# Patient Record
Sex: Male | Born: 1945 | Race: White | Hispanic: No | Marital: Married | Smoking: Former smoker
Health system: Southern US, Community
[De-identification: ages and names within clinical notes are randomized; demographics above are authoritative.]

## PROBLEM LIST (undated history)

## (undated) DIAGNOSIS — I10 Essential (primary) hypertension: Secondary | ICD-10-CM

## (undated) DIAGNOSIS — E785 Hyperlipidemia, unspecified: Secondary | ICD-10-CM

## (undated) DIAGNOSIS — K56609 Unspecified intestinal obstruction, unspecified as to partial versus complete obstruction: Secondary | ICD-10-CM

## (undated) DIAGNOSIS — R7302 Impaired glucose tolerance (oral): Secondary | ICD-10-CM

## (undated) DIAGNOSIS — G40909 Epilepsy, unspecified, not intractable, without status epilepticus: Secondary | ICD-10-CM

## (undated) DIAGNOSIS — R569 Unspecified convulsions: Secondary | ICD-10-CM

## (undated) DIAGNOSIS — C61 Malignant neoplasm of prostate: Secondary | ICD-10-CM

## (undated) DIAGNOSIS — N390 Urinary tract infection, site not specified: Secondary | ICD-10-CM

## (undated) HISTORY — PX: APPENDECTOMY: SHX54

## (undated) HISTORY — DX: Essential (primary) hypertension: I10

## (undated) HISTORY — DX: Epilepsy, unspecified, not intractable, without status epilepticus: G40.909

## (undated) HISTORY — DX: Impaired glucose tolerance (oral): R73.02

## (undated) HISTORY — DX: Unspecified convulsions: R56.9

## (undated) HISTORY — PX: COLONOSCOPY W/ BIOPSIES: SHX1374

## (undated) HISTORY — DX: Malignant neoplasm of prostate: C61

## (undated) HISTORY — DX: Hyperlipidemia, unspecified: E78.5

## (undated) HISTORY — DX: Urinary tract infection, site not specified: N39.0

## (undated) HISTORY — PX: FRACTURE SURGERY: SHX138

## (undated) HISTORY — DX: Unspecified intestinal obstruction, unspecified as to partial versus complete obstruction: K56.609

---

## 2000-11-19 ENCOUNTER — Ambulatory Visit (HOSPITAL_COMMUNITY): Admission: RE | Admit: 2000-11-19 | Discharge: 2000-11-19 | Payer: Self-pay | Admitting: General Surgery

## 2002-07-27 ENCOUNTER — Ambulatory Visit (HOSPITAL_COMMUNITY): Admission: RE | Admit: 2002-07-27 | Discharge: 2002-07-27 | Payer: Self-pay | Admitting: General Surgery

## 2006-09-05 ENCOUNTER — Ambulatory Visit: Payer: Self-pay | Admitting: Internal Medicine

## 2006-09-18 ENCOUNTER — Ambulatory Visit (HOSPITAL_COMMUNITY): Admission: RE | Admit: 2006-09-18 | Discharge: 2006-09-18 | Payer: Self-pay | Admitting: Gastroenterology

## 2006-09-18 ENCOUNTER — Ambulatory Visit: Payer: Self-pay | Admitting: Gastroenterology

## 2006-09-18 ENCOUNTER — Encounter (INDEPENDENT_AMBULATORY_CARE_PROVIDER_SITE_OTHER): Payer: Self-pay | Admitting: Specialist

## 2006-10-08 ENCOUNTER — Inpatient Hospital Stay (HOSPITAL_COMMUNITY): Admission: RE | Admit: 2006-10-08 | Discharge: 2006-10-14 | Payer: Self-pay | Admitting: General Surgery

## 2006-10-08 ENCOUNTER — Encounter (INDEPENDENT_AMBULATORY_CARE_PROVIDER_SITE_OTHER): Payer: Self-pay | Admitting: General Surgery

## 2006-10-08 HISTORY — PX: COLONOSCOPY W/ BIOPSIES AND POLYPECTOMY: SHX1376

## 2006-10-08 HISTORY — PX: COLON SURGERY: SHX602

## 2008-01-22 ENCOUNTER — Emergency Department (HOSPITAL_COMMUNITY): Admission: EM | Admit: 2008-01-22 | Discharge: 2008-01-22 | Payer: Self-pay | Admitting: Emergency Medicine

## 2008-07-26 ENCOUNTER — Ambulatory Visit (HOSPITAL_COMMUNITY): Admission: RE | Admit: 2008-07-26 | Discharge: 2008-07-26 | Payer: Self-pay | Admitting: Family Medicine

## 2008-07-27 ENCOUNTER — Encounter (HOSPITAL_COMMUNITY): Admission: RE | Admit: 2008-07-27 | Discharge: 2008-08-30 | Payer: Self-pay | Admitting: Family Medicine

## 2009-02-14 ENCOUNTER — Inpatient Hospital Stay (HOSPITAL_COMMUNITY): Admission: EM | Admit: 2009-02-14 | Discharge: 2009-02-16 | Payer: Self-pay | Admitting: Emergency Medicine

## 2009-09-26 ENCOUNTER — Ambulatory Visit (HOSPITAL_COMMUNITY): Admission: RE | Admit: 2009-09-26 | Discharge: 2009-09-26 | Payer: Self-pay | Admitting: General Surgery

## 2010-03-02 ENCOUNTER — Emergency Department (HOSPITAL_COMMUNITY): Admission: EM | Admit: 2010-03-02 | Discharge: 2010-03-03 | Payer: Self-pay | Admitting: Emergency Medicine

## 2010-08-16 LAB — DIFFERENTIAL
Basophils Absolute: 0 10*3/uL (ref 0.0–0.1)
Basophils Absolute: 0 10*3/uL (ref 0.0–0.1)
Basophils Relative: 0 % (ref 0–1)
Basophils Relative: 0 % (ref 0–1)
Eosinophils Absolute: 0.1 10*3/uL (ref 0.0–0.7)
Eosinophils Relative: 1 % (ref 0–5)
Eosinophils Relative: 2 % (ref 0–5)
Lymphocytes Relative: 15 % (ref 12–46)
Lymphs Abs: 1.3 10*3/uL (ref 0.7–4.0)
Monocytes Absolute: 0.4 10*3/uL (ref 0.1–1.0)
Monocytes Absolute: 0.4 10*3/uL (ref 0.1–1.0)
Monocytes Relative: 5 % (ref 3–12)
Monocytes Relative: 9 % (ref 3–12)
Neutro Abs: 3.4 10*3/uL (ref 1.7–7.7)
Neutrophils Relative %: 65 % (ref 43–77)
Neutrophils Relative %: 74 % (ref 43–77)
Neutrophils Relative %: 78 % — ABNORMAL HIGH (ref 43–77)

## 2010-08-16 LAB — COMPREHENSIVE METABOLIC PANEL
ALT: 20 U/L (ref 0–53)
ALT: 21 U/L (ref 0–53)
Albumin: 4 g/dL (ref 3.5–5.2)
Alkaline Phosphatase: 180 U/L — ABNORMAL HIGH (ref 39–117)
BUN: 18 mg/dL (ref 6–23)
Calcium: 8.6 mg/dL (ref 8.4–10.5)
Calcium: 9.2 mg/dL (ref 8.4–10.5)
Chloride: 100 mEq/L (ref 96–112)
Chloride: 103 mEq/L (ref 96–112)
Creatinine, Ser: 0.68 mg/dL (ref 0.4–1.5)
Creatinine, Ser: 0.77 mg/dL (ref 0.4–1.5)
GFR calc Af Amer: >60 mL/min (ref >60)
GFR calc non Af Amer: >60 mL/min (ref >60)
Glucose, Bld: 106 mg/dL — ABNORMAL HIGH (ref 70–99)
Potassium: 4 mEq/L (ref 3.5–5.1)
Sodium: 139 mEq/L (ref 135–145)
Total Bilirubin: 0.2 mg/dL — ABNORMAL LOW (ref 0.3–1.2)
Total Protein: 6.9 g/dL (ref 6.0–8.3)
Total Protein: 7.5 g/dL (ref 6.0–8.3)

## 2010-08-16 LAB — URINALYSIS, ROUTINE W REFLEX MICROSCOPIC
Glucose, UA: NEGATIVE mg/dL
Ketones, ur: NEGATIVE mg/dL
Nitrite: NEGATIVE
Protein, ur: NEGATIVE mg/dL

## 2010-08-16 LAB — CBC
HCT: 40.8 % (ref 39.0–52.0)
Hemoglobin: 13.4 g/dL (ref 13.0–17.0)
Hemoglobin: 14.6 g/dL (ref 13.0–17.0)
MCHC: 34.5 g/dL (ref 30.0–36.0)
MCHC: 34.6 g/dL (ref 30.0–36.0)
MCV: 86.1 fL (ref 78.0–100.0)
MCV: 86.1 fL (ref 78.0–100.0)
MCV: 86.3 fL (ref 78.0–100.0)
Platelets: 176 10*3/uL (ref 150–400)

## 2010-08-16 LAB — PHENOBARBITAL LEVEL: Phenobarbital: 14 ug/mL — ABNORMAL LOW (ref 15.0–40.0)

## 2010-08-16 LAB — BASIC METABOLIC PANEL
BUN: 8 mg/dL (ref 6–23)
Chloride: 103 mEq/L (ref 96–112)

## 2010-08-16 LAB — PROTIME-INR: Prothrombin Time: 13.4 seconds (ref 11.6–15.2)

## 2010-08-16 LAB — AMYLASE: Amylase: 89 U/L (ref 27–131)

## 2010-08-16 LAB — TYPE AND SCREEN: ABO/RH(D): O POS

## 2010-09-25 NOTE — Op Note (Signed)
NAME:  Brent Suarez, Brent Suarez NO.:  [id number]   MEDICAL RECORD NO.:  [id number]          PATIENT TYPE:  INP   LOCATION:  A339                          FACILITY:  [facility]   PHYSICIAN:  Dalia Heading, M.D.  DATE OF BIRTH:  [birth year]   DATE OF PROCEDURE:  10/08/2006  DATE OF DISCHARGE:                               OPERATIVE REPORT   PREOPERATIVE DIAGNOSIS:  Colon neoplasm.   POSTOPERATIVE DIAGNOSIS:  Colon neoplasm.   PROCEDURE:  Hand-assisted laparoscopic partial colectomy, right.   SURGEON:  Franky Macho, MD   ANESTHESIA:  General endotracheal.   INDICATIONS:  The patient is a 65 year old white male who has had  multiple attempts at resection of a proximal transverse colon neoplasm,  but it keeps recurring.  The patient now comes to the operating room for  a laparoscopic right hemicolectomy.  The risks and benefits of the  procedure including bleeding, infection, cardiopulmonary difficulties,  and the possibility of a blood transfusion were fully explained to the  patient, who gave informed consent.   PROCEDURE NOTE:  The patient was placed in the supine position.  After  induction of general endotracheal anesthesia, the abdomen was prepped  and draped using the usual sterile technique with Betadine.  Surgical  site confirmation was performed.   A midline infraumbilical incision was made into the peritoneum.  A  GelPort was then inserted.  An additional 11-mm trocar was placed in the  supraumbilical region and an 11-mm trocar was placed in the epigastric  region.  The abdomen was insufflated.  The liver was inspected and noted  to be within normal limits.  The nasogastric tube was noted be in  appropriate position in the stomach.  The right colon was mobilized  using blunt dissection as well as cautery; this was done to the proximal  transverse colon.  The lesion was marked previously by tattooing  endoscopically; this was visualized.  A GIA stapler was placed  across  the terminal ileum and fired.  The mesentery was then divided using the  laparoscopic LigaSure; this was done to the point of the proximal  transverse colon, proximal to the middle colic artery.  The intestines  were then exteriorized and a GIA stapler was placed across the  transverse colon at a point distal to the tattoo mark.  The specimen was  then removed from the operative field.  It was inspected in the  operating room and the distal margin was noted be free of tumor.  The  specimen was then sent to Pathology for further examination.  An  additional 5-cm of transverse colon was excised in order to facilitate  an anastomosis.  An ileocolic side-to-side anastomosis was then  performed using a GIA 70 stapler.  The enterotomy was closed using a TA-  60 stapler.  The staple line was bolstered using 3-0 silk sutures.  Omentum was then used to cover the anastomosis.  The mesenteric defect  was closed using an 0 chromic gut running suture.  The bowel was then  returned into the abdominal cavity in an orderly fashion.  The abdominal  cavity was first copiously irrigated with normal saline.  All fluid was  then evacuate from the abdominal cavity.   The fascia was reapproximated using a looped 0 Novofil running suture.  The subcutaneous layer was irrigated with normal saline and skin was  closed using staples; this was likewise done to the epigastric incision.  Of note was the fact that after the bowel was exteriorized, the lower  midline incision was extended to the supraumbilical incision in order to  facilitate exposure of the transverse colon for the extracorporeal  anastomosis.  Betadine ointment and dry sterile dressing were applied.   All tape and needle counts were correct at the end of the procedure.  The patient was extubated in the operating room and went back to the  recovery room, awake and in stable condition.   COMPLICATIONS:  None.   SPECIMEN:  Terminal ileum,  ascending colon, transverse colon.   BLOOD LOSS:  150 mL      Dalia Heading, M.D.  Electronically Signed     MAJ/MEDQ  D:  10/08/2006  T:  10/08/2006  Job:  [id number]   cc:   Kassie Mends, M.D.  [address]  [place] , [place] [address]   W. Simone Curia, M.D.  Fax: [phone]

## 2010-09-25 NOTE — Discharge Summary (Signed)
NAME:  Brent Suarez, STENCIL NO.:  [id number]   MEDICAL RECORD NO.:  [id number]          PATIENT TYPE:  INP   LOCATION:  A339                          FACILITY:  [facility]   PHYSICIAN:  Dalia Heading, M.D.  DATE OF BIRTH:  [birth year]   DATE OF ADMISSION:  10/08/2006  DATE OF DISCHARGE:  06/03/2008LH                               DISCHARGE SUMMARY   HOSPITAL COURSE SUMMARY:  The patient is a 65 year old white male who  presented to Surgery for a partial colectomy.  He had recurrent tubular  adenoma in the proximal transverse colon.  Despite multiple  colonoscopies and polypectomies, the neoplasm continued to recur.  He  underwent a hand-assisted laparoscopic right hemicolectomy on Oct 08, 2006.  He tolerated the surgery well.  His postoperative course was  remarkable for a postoperative ileus which subsequently resolved.  His  diet was advanced without difficulty except for one episode of emesis  yesterday which was felt to be secondary to milk of magnesia and  MiraLax.  He is being discharged home on 10/14/2006 in good, improving  condition.  Final pathology revealed a benign tubular adenoma without  evidence of malignancy.   DISCHARGE MEDICATIONS INCLUDE:  1. Vicodin 1-2 tablets p.o. q.4 h p.r.n. pain.  2. Baby aspirin 1 tablet p.o. daily.  3. Dilantin 300 mg p.o. q.h.s.  4. Phenobarbital 9cih mg p.o. q.h.s.  5. Prevacid 1 tablet p.o. daily.   FOLLOW UP:  The patient is to follow up Dr. Franky Macho on October 16, 2006.   PRINCIPAL DIAGNOSES:  1. Tubulovillous adenoma of colon.  2. History of seizure disorder.  3. Postoperative ileus, resolved.   PRINCIPAL PROCEDURE:  Hand-assisted laparoscopic right colectomy on Oct 08, 2006.      Dalia Heading, M.D.  Electronically Signed     MAJ/MEDQ  D:  10/14/2006  T:  10/14/2006  Job:  [id number]   cc:   Donna Bernard, M.D.  Fax: [phone]   Kassie Mends, M.D.  [address]  [place] , [place] [address]

## 2010-09-25 NOTE — H&P (Signed)
NAME:  Brent Suarez, HOLLINGS NO.:  [id number]   MEDICAL RECORD NO.:  [id number]          PATIENT TYPE:  AMB   LOCATION:                                FACILITY:  [facility]   PHYSICIAN:  Dalia Heading, M.D.  DATE OF BIRTH:  [birth year]   DATE OF ADMISSION:  DATE OF DISCHARGE:  LH                              HISTORY & PHYSICAL   CHIEF COMPLAINT:  Neoplasm.   HISTORY OF PRESENT ILLNESS:  The patient is a 65 year old white male who  is referred for evaluation and treatment of a recurrent tubular adenoma  of the proximal transverse colon.  He has had multiple colonoscopies and  polypectomies in the past.  But, the neoplasm continues to recur.  It is  felt that further attempts at colonoscopic resection may result in  perforation of the colon.  He is referred for a colon resection.   PAST MEDICAL HISTORY:  1. Epilepsy with his last seizure occurring in the remote past.  2. Right arm surgery.   CURRENT MEDICATIONS:  1. Dilantin 300 mg p.o. q.h.s.  2. Phenobarbital 90 mg p.o. q.h.s.   ALLERGIES:  NO KNOWN DRUG ALLERGIES.   REVIEW OF SYSTEMS:  The patient denies drinking or smoking.  Denies any  other cardiopulmonary difficulties or bleeding disorders.   PHYSICAL EXAMINATION:  GENERAL:  The patient was a well-developed, well-  nourished white male in no acute distress.  LUNGS:  Clear to auscultation with equal breath sounds bilaterally.  HEART:  Reveals a regular rate and rhythm without S3, S4, or murmurs.  ABDOMEN:  Soft, nontender, nondistended.  No hepatosplenomegaly, masses,  or hernias are identified.   IMPRESSION:  Neoplasm, unspecified.   PLAN:  The patient is scheduled for a laparoscopic, hand-assisted,  partial colectomy on Oct 08, 2006.  The risks and benefits of the  procedure including bleeding, infection, cardiopulmonary difficulties,  the possibility of a blood transfusion, as well as the possibility of a  full open procedure were fully explained to the  patient, who gave  informed consent.      Dalia Heading, M.D.  Electronically Signed     MAJ/MEDQ  D:  09/25/2006  T:  09/25/2006  Job:  [id number]   cc:   [facility]  Fax: [phone]   Kassie Mends, M.D.  [address]  [place] , [place] [address]   W. Simone Curia, M.D.  Fax: [phone]

## 2010-09-28 NOTE — H&P (Signed)
NAME:  Brent Suarez, Brent Suarez NO.:  [id number]   MEDICAL RECORD NO.:  [id number]         PATIENT TYPE:  AMB   LOCATION:                                FACILITY:  [facility]   PHYSICIAN:  R. Roetta Sessions, M.D. DATE OF BIRTH:  1945/09/16   DATE OF ADMISSION:  DATE OF DISCHARGE:  LH                              HISTORY & PHYSICAL   GASTROENTEROLOGY CONSULT:   REFERRING PHYSICIAN:  Donna Bernard, M.D.   REASON FOR CONSULTATION:  History of tubular adenoma.   HISTORY OF PRESENT ILLNESS:  Brent Suarez is a 65 year old Caucasian male  who underwent colonoscopy by Dr. Lovell Sheehan back in 2002.  He was found to  have a tubular adenoma at the proximal transverse colon.  It was removed  in piecemeal fashion.  He then had a followup colonoscopy in July 2002.  In the proximal transverse colon, a sessile remnant of the adenoma was  found, which was removed and fulgurated using snare cautery.  Biopsy  showed inflammation.  He had a third colonoscopy, August 06, 2002, by Dr.  Lovell Sheehan and was found to have a single friable nonbleeding sessile polyp  in the proximal transverse colon which was also a tubular adenoma.  It  was recommended that he have a repeat exam in 2005; however, he is  presenting today for followup.  He denies any GI complaints at this  time.  He has noticed 1 episode of small-volume bright red blood noted  on his toilet paper with wiping; other than this, he denies any ongoing  rectal bleeding or melena.  He denies any abdominal pain, constipation  or diarrhea.  He generally has a daily soft brown bowel movement.   PAST MEDICAL HISTORY:  1. Epilepsy; his last seizure was over 25 years ago.  2. History of multiple tubular adenomas in the transverse colon with      previous colonoscopies by Dr. Lovell Sheehan as described in HPI.  3. He had a right arm fracture and repair.   CURRENT MEDICATIONS:  1. Aspirin 81 mg daily.  2. Dilantin 300 mg nightly.  3. Phenobarbital 90 mg  nightly.   ALLERGIES:  No known drug allergies.   FAMILY HISTORY:  There is no known family history of colorectal  carcinoma, liver or chronic GI problems.   SOCIAL HISTORY:  [name] is married and he has 2 grown healthy  children.  He is employed with [employer].  He has a 30-plus-pack-year  history of tobacco use, quitting 20 years ago.  He denies any alcohol or  drug use.   REVIEW OF SYSTEMS:  CONSTITUTIONAL:  His weight has been stable.  He  denies any fever, chills or fatigue.  CARDIOVASCULAR:  He denies any  chest pain or palpitations.  PULMONARY:  No shortness of breath,  dyspnea, cough or hemoptysis.  GI:  See HPI.  He does have rare  heartburn and does not take any medicines for this.  He denies any  dysphagia, odynophagia, anorexia or early satiety.   PHYSICAL EXAMINATION:  VITAL SIGNS:  Weight 208 pounds, height 71  inches.  Temperature 97.4, blood pressure 112/80 and pulse of 60.  GENERAL:  Brent Suarez is a 65 year old Caucasian male who is alert,  oriented, pleasant and cooperative, in no acute distress.  He is  accompanied by his wife today.  HEENT:  Sclerae clear, anicteric.  Conjunctivae pink.  Oropharynx is  pink and moist without any lesions.  NECK:  Supple without mass or thyromegaly.  CHEST:  Heart with regular rate and rhythm, normal S1 and S2, without  murmurs, clicks, rubs or gallops.  LUNGS:  Clear to auscultation bilaterally.  ABDOMEN.  Positive bowel sounds x4.  No bruits auscultated.  He does  have a small, easily reducible umbilical hernia which is nontender.  The  abdomen is without tenderness or guarding.  There is no  hepatosplenomegaly or mass.  Exam is limited, given the patient's body  habitus.  EXTREMITIES:  Without clubbing or edema bilaterally.  SKIN:  Pink, warm and dry without any rash or jaundice.   IMPRESSION:  Brent Suarez is a 65 year old male with history of large  tubular adenoma in transverse colon, removed by Dr. Lovell Sheehan back in  2002.   Six months later, he had piecemeal complete removal of an  adenoma.  Two years later, he had another tubular adenoma in the  transverse colon removed by Dr. Lovell Sheehan.  He is overdue for followup  surveillance colonoscopy.   PLAN:  Colonoscopy with Dr. Cira Servant in the near future.  I discussed the  procedure including the risks and benefits including, but not limited  to, bleeding, infection, perforation or drug reaction.  He agrees with  plan and consent has been obtained.  He is going to hold his aspirin for  3 days prior to the procedure.   I would like to thank Dr. Simone Curia for allowing [place] to participate  in the care of Mr. Brent Suarez.      Nicholas Lose, N.P.      Jonathon Bellows, M.D.  Electronically Signed    KC/MEDQ  D:  09/05/2006  T:  09/05/2006  Job:  [id number]   cc:   Donna Bernard, M.D.  Fax: [phone]

## 2010-09-28 NOTE — Op Note (Signed)
NAME:  Brent Suarez, Brent Suarez NO.:  [id number]   MEDICAL RECORD NO.:  [id number]          PATIENT TYPE:  AMB   LOCATION:  DAY                           FACILITY:  [facility]   PHYSICIAN:  Kassie Mends, M.D.      DATE OF BIRTH:  Sep 04, 1945   DATE OF PROCEDURE:  09/18/2006  DATE OF DISCHARGE:                                PROCEDURE NOTE   PROCEDURE:  Colonoscopy with snare cautery, partial polypectomy.   INDICATIONS FOR EXAM:  Brent Suarez is a 65 year old male who had a large  transverse colon polyp removed piecemeal in 2004.  He presents for  screening colonoscopy.   FINDINGS:  1. Large proximal transverse colon polyp.  The transverse colon polyp      again is seen on a fold and spreads out proximally towards the      ascending colon.  The polyp was only able to be removed partially      via snare cautery.  The most proximal portion of the polyp would      need to be removed via endoscopic mucosal resection.  The polyp was      tattooed proximally with 2 mL of [place] ink.  It was also tattooed      distally with 2 mL of [place] ink.  2. Otherwise no additional polyps, masses, inflammatory changes,      diverticula or AVMs.  3. Normal retroflex view of the rectum.   RECOMMENDATIONS:  1. I discussed with Brent Suarez his management options for the      remaining portion of his polyp.  His treatment options included      endoscopic mucosal resection at [facility] or surgery.  He      elected to have surgical resection.  2. He has an appointment scheduled with Dr. Franky Macho on May 15.  3. Low-residue diet for seven days, then high-fiber diet.  He is given      a handout on low-residue diet, high-fiber diet and polyps.  4. No aspirin, NSAIDs, or anticoagulation for seven days.  5. Screening colonoscopy in 5 years.   MEDICATIONS:  1. Demerol 50 mg IV.  2. Four milligrams IV.   PROCEDURE TECHNIQUE:  Physical exam was performed.  Informed consent was  obtained from the  patient explaining the benefits, risks and  alternatives to the procedure.  The patient was connected to the monitor  and placed in the left lateral position.  Continuous oxygen was provided  by nasal cannula and IV medicine administered through an indwelling  cannula.  After administration of sedation and rectal exam, the  patient's rectum was intubated  and the scope was advanced under direct visualization to the cecum.  The  scope was subsequently removed slowly by carefully examining the color,  texture, anatomy and integrity of the mucosa on the way out.  The  patient was recovered from endoscopy and discharged home in satisfactory  condition.      Kassie Mends, M.D.  Electronically Signed     SM/MEDQ  D:  09/18/2006  T:  09/18/2006  Job:  [id number]   cc:   Donna Bernard, M.D.  Fax: [phone]   Dalia Heading, M.D.  Fax: [phone]

## 2010-09-28 NOTE — H&P (Signed)
   NAME:  Brent Suarez, Brent Suarez NO.:  [id number]   MEDICAL RECORD NO.:  [id number]                   PATIENT TYPE:   LOCATION:                                       FACILITY:   PHYSICIAN:  Dalia Heading, M.D.               DATE OF BIRTH:   DATE OF ADMISSION:  DATE OF DISCHARGE:                                HISTORY & PHYSICAL   CHIEF COMPLAINT:  History of colon polyps.   HISTORY OF PRESENT ILLNESS:  The patient is a 65 year old, white male status  post a colonoscopy with polypectomy in July 2002, who now presents for a  followup colonoscopy.  The polyp was noted in the proximal transverse colon.  It was removed in a piecemeal fashion previously.  Since that time, he  denies any lightheadedness, weight loss, fever, abdominal pain,  constipation, diarrhea or hematochezia.  He denies hemorrhoidal problems or  family history of colon carcinoma.   PAST MEDICAL HISTORY:  Seizure disorder, unspecified.   PAST SURGICAL HISTORY:  As noted in HPI.   MEDICATIONS:  1. Dilantin.  2. Phenobarbital.   ALLERGIES:  No known drug allergies.   REVIEW OF SYMPTOMS:  Unremarkable.   PHYSICAL EXAMINATION:  GENERAL:  The patient is a well-developed, well-  nourished, white male in no acute distress.  VITAL SIGNS:  Afebrile, vital signs stable.  LUNGS:  Clear to auscultation with equal breath sounds bilaterally.  HEART:  Regular rate and rhythm without S3, S4 or murmurs.  ABDOMEN:  Soft, nontender, nondistended, no hepatosplenomegaly or masses  were noted.  RECTAL:  Deferred to the procedure.   IMPRESSION:  History of tubular adenoma, transverse colon.    PLAN:  The patient was scheduled for a colonoscopy on July 01, 2002.  The risks and benefits of the procedure including bleeding and perforation  were fully explained to the patient gaining informed consent.                                               Dalia Heading, M.D.    MAJ/MEDQ  D:  06/03/2002   T:  06/03/2002  Job:  [id number]   cc:   Donna Bernard, M.D.  [address]  [place]  [place] [address]  Fax: [phone]

## 2010-10-01 ENCOUNTER — Other Ambulatory Visit (HOSPITAL_COMMUNITY): Payer: Self-pay | Admitting: Urology

## 2010-10-04 ENCOUNTER — Ambulatory Visit (HOSPITAL_COMMUNITY)
Admission: RE | Admit: 2010-10-04 | Discharge: 2010-10-04 | Disposition: A | Discharge status: Home / Self Care | Payer: BC Managed Care – PPO | Source: Ambulatory Visit | Attending: Urology | Admitting: Urology

## 2010-10-04 DIAGNOSIS — R319 Hematuria, unspecified: Secondary | ICD-10-CM | POA: Insufficient documentation

## 2010-10-04 DIAGNOSIS — K429 Umbilical hernia without obstruction or gangrene: Secondary | ICD-10-CM | POA: Insufficient documentation

## 2010-10-04 DIAGNOSIS — K5730 Diverticulosis of large intestine without perforation or abscess without bleeding: Secondary | ICD-10-CM | POA: Insufficient documentation

## 2010-10-04 DIAGNOSIS — Z9049 Acquired absence of other specified parts of digestive tract: Secondary | ICD-10-CM | POA: Insufficient documentation

## 2010-10-04 DIAGNOSIS — N400 Enlarged prostate without lower urinary tract symptoms: Secondary | ICD-10-CM | POA: Insufficient documentation

## 2010-10-04 MED ORDER — IOHEXOL 300 MG/ML  SOLN
150.0000 mL | Freq: Once | INTRAMUSCULAR | Status: AC | PRN
  Administered 2010-10-04: 150 mL via INTRAVENOUS

## 2010-10-29 ENCOUNTER — Encounter (HOSPITAL_COMMUNITY)
Admission: RE | Admit: 2010-10-29 | Discharge: 2010-10-29 | Disposition: A | Discharge status: Home / Self Care | Payer: BC Managed Care – PPO | Source: Ambulatory Visit | Attending: Urology | Admitting: Urology

## 2010-10-29 LAB — CBC
Hemoglobin: 14.4 g/dL (ref 13.0–17.0)
MCH: 29.4 pg (ref 26.0–34.0)
MCHC: 34.3 g/dL (ref 30.0–36.0)
WBC: 5.2 10*3/uL (ref 4.0–10.5)

## 2010-10-29 LAB — SURGICAL PCR SCREEN
MRSA, PCR: POSITIVE — AB
Staphylococcus aureus: POSITIVE — AB

## 2010-10-29 LAB — BASIC METABOLIC PANEL
CO2: 29 mEq/L (ref 19–32)
Chloride: 103 mEq/L (ref 96–112)
GFR calc non Af Amer: >60 mL/min (ref >60)
Potassium: 4.3 mEq/L (ref 3.5–5.1)

## 2010-10-31 ENCOUNTER — Ambulatory Visit (HOSPITAL_COMMUNITY)
Admission: RE | Admit: 2010-10-31 | Discharge: 2010-10-31 | Disposition: A | Discharge status: Home / Self Care | Payer: BC Managed Care – PPO | Source: Ambulatory Visit | Attending: Urology | Admitting: Urology

## 2010-10-31 ENCOUNTER — Ambulatory Visit (HOSPITAL_COMMUNITY): Payer: BC Managed Care – PPO

## 2010-10-31 DIAGNOSIS — I10 Essential (primary) hypertension: Secondary | ICD-10-CM | POA: Insufficient documentation

## 2010-10-31 DIAGNOSIS — Z79899 Other long term (current) drug therapy: Secondary | ICD-10-CM | POA: Insufficient documentation

## 2010-10-31 DIAGNOSIS — Z7982 Long term (current) use of aspirin: Secondary | ICD-10-CM | POA: Insufficient documentation

## 2010-10-31 DIAGNOSIS — N1330 Unspecified hydronephrosis: Secondary | ICD-10-CM | POA: Insufficient documentation

## 2010-10-31 DIAGNOSIS — Z01812 Encounter for preprocedural laboratory examination: Secondary | ICD-10-CM | POA: Insufficient documentation

## 2010-10-31 DIAGNOSIS — R319 Hematuria, unspecified: Secondary | ICD-10-CM | POA: Insufficient documentation

## 2010-10-31 NOTE — H&P (Signed)
NAME:  Brent Suarez, Brent Suarez NO.:  [id number]  MEDICAL RECORD NO.:  [id number]  LOCATION:  DAY                           FACILITY:  [facility]  PHYSICIAN:  Dennie Maizes, M.D.   DATE OF BIRTH:  [birth year]  DATE OF ADMISSION:  10/25/2010 DATE OF DISCHARGE:  LH                             HISTORY & PHYSICAL   CHIEF COMPLAINT:  Painful urination, intermittent, gross hematuria.  HISTORY OF PRESENT ILLNESS:  A 64 year old male was referred to me by Dr. Simone Curia for evaluation and management of hematuria.  The patient experienced painful urination as well as intermittent hematuria for about 2-3 days.  He did not have any fever, chills, voiding difficulty, abdominal pain.  He had mild bilateral lower quadrant abdominal pain on both sides, had urinary frequency x5 and nocturia x4 to 5.  Also past history of urolithiasis, urinary tract infections.  PAST MEDICAL HISTORY:  History of hypertension, seizure disorder, status post colon resection for benign lesion about 4 years ago.  MEDICATIONS: 1. Aspirin 81 mg 1 p.o. daily. 2. Simvastatin 10 mg p.o. daily. 3. Phenytoin sodium 100 mg 3 tablets at bedtime. 4. Phenobarbital 30 mg x3 at bedtime.  ALLERGIES:  None.  FAMILY HISTORY:  Positive for heart surgery, coronary artery disease, high blood pressure, high cholesterol, carcinoma of the lung.  REVIEW OF SYSTEMS:  Positive for tiredness, sluggishness, abdominal pain, painful urination, urinary frequency.  SOCIAL HISTORY:  The patient is a nonsmoker.  He does not drink alcohol.  PHYSICAL EXAMINATION:  GENERAL:  The patient is comfortable at the time of examination. VITAL SIGNS:  Height 5 feet 10 inches, weight 194 pounds. HEAD, NECK, EARS, NOSE, AND THROAT:  Normal LUNGS:  Clear to auscultation. HEART:  Regular rate and rhythm.  No murmurs. ABDOMEN:  Soft.  No palpable flat masses or CVA tenderness.  Bladder not palpable.  Penis and testes normal. RECTAL:  Benign  prostate about 45 g in size.  The patient's renal function is normal.  BUN 13, creatinine 0.93.  Urine cytology revealed no evidence of malignant or dysplastic cells.  CT scan of the abdomen and pelvis with and without contrast has been done at [facility].  This revealed no evidence of renal masses or lesions.  There is no evidence of urinary calculi, moderately dilated right renal collecting system and renal pelvis are noted, minimal dilation of the left collecting system was seen.  These findings are unchanged compared to the previous CT scan done in October 2010.  There is no filling defects in the collecting system.  Minimal colonic diverticulosis was noted.  The appearance of bladder was normal, except bladder wall thickening.  IMPRESSION:  Hematuria, bilateral hydronephrosis.  PLAN:  I discussed with patient regarding the evaluation so far.  He was scheduled to undergo cystoscopy, possible bladder biopsy, bilateral retrograde pyelograms, and anesthesia at [facility].  I have informed the patient regarding the diagnosis, operative details, alternative treatments, outcome, possible risks and complications, and he is agreed for the procedure to be done.     Dennie Maizes, M.D.     SK/MEDQ  D:  10/30/2010  T:  10/31/2010  Job:  [id number]  cc:   Donna Bernard, M.D. Fax: [phone]  [facility]  Electronically Signed by Dennie Maizes M.D. on 10/31/2010 10:44:42 AM

## 2011-02-28 LAB — BASIC METABOLIC PANEL
Calcium: 7.9 — ABNORMAL LOW
Creatinine, Ser: 0.8
GFR calc Af Amer: >60

## 2011-02-28 LAB — DIFFERENTIAL
Basophils Relative: 0
Lymphs Abs: 0.9
Monocytes Relative: 12 — ABNORMAL HIGH
Neutro Abs: 2.9
Neutrophils Relative %: 65

## 2011-02-28 LAB — CBC
RBC: 3.99 — ABNORMAL LOW
WBC: 4.5

## 2011-10-10 DIAGNOSIS — C61 Malignant neoplasm of prostate: Secondary | ICD-10-CM

## 2011-10-10 HISTORY — DX: Malignant neoplasm of prostate: C61

## 2011-10-25 ENCOUNTER — Encounter: Payer: Self-pay | Admitting: *Deleted

## 2011-10-28 ENCOUNTER — Ambulatory Visit
Admission: RE | Admit: 2011-10-28 | Discharge: 2011-10-28 | Disposition: A | Discharge status: Home / Self Care | Payer: BC Managed Care – PPO | Source: Ambulatory Visit | Attending: Radiation Oncology | Admitting: Radiation Oncology

## 2011-10-28 ENCOUNTER — Telehealth: Payer: Self-pay | Admitting: Radiation Oncology

## 2011-10-28 ENCOUNTER — Encounter: Payer: Self-pay | Admitting: Radiation Oncology

## 2011-10-28 VITALS — BP 120/75 | HR 79 | Temp 96.4°F | Resp 18 | Ht 70.0 in | Wt 206.7 lb

## 2011-10-28 DIAGNOSIS — Z79899 Other long term (current) drug therapy: Secondary | ICD-10-CM | POA: Insufficient documentation

## 2011-10-28 DIAGNOSIS — E785 Hyperlipidemia, unspecified: Secondary | ICD-10-CM | POA: Insufficient documentation

## 2011-10-28 DIAGNOSIS — C61 Malignant neoplasm of prostate: Secondary | ICD-10-CM | POA: Insufficient documentation

## 2011-10-28 DIAGNOSIS — I129 Hypertensive chronic kidney disease with stage 1 through stage 4 chronic kidney disease, or unspecified chronic kidney disease: Secondary | ICD-10-CM | POA: Insufficient documentation

## 2011-10-28 DIAGNOSIS — N189 Chronic kidney disease, unspecified: Secondary | ICD-10-CM | POA: Insufficient documentation

## 2011-10-28 NOTE — Telephone Encounter (Signed)
Following completion by Dr. Kathrynn Running and at the patient request faxed [employer] 3 page Family Medical Leave Act form to [phone]. Confirmation of delivery fax obtained.

## 2011-10-28 NOTE — Progress Notes (Signed)
Patient presents to the clinic today accompanied by two family members for a consultation with Dr. Kathrynn Running for consideration of radiation therapy in the treatment of prostate ca. Patient is alert and oriented to person, place, and time. No distress noted. Steady gait noted. Pleasant affect noted. Patient denies pain at this time. Patient reports on average he gets up 2-3 times per night to void. Patient reports taking Flomax for 3.5 weeks. Patient denies hematuria. Patient reports his stream alternates between strong and weak. Patient denies burning with urination. Patient reports internal and external hemorrhoids. Patient denies pain during bowel movements. Patient denies blood in his stool. Patient works five days a week. Patient reports eating without difficulty. Patient reports his weight has remained stable. Patient denies nausea, vomiting, headache, or dizziness. Patient reports occasional diarrhea. Reported all findings to Dr. Kathrynn Running.  NKDA No history of radiation therapy Denies having a pacemaker Patient not taking steroids

## 2011-10-28 NOTE — Progress Notes (Signed)
Please see progress note under physician encounter 

## 2011-10-28 NOTE — Addendum Note (Signed)
Encounter addended by: Tessa Lerner, RN on: 10/28/2011  4:18 PM<BR>     Documentation filed: Charges VN

## 2011-10-28 NOTE — Progress Notes (Signed)
Complete PATIENT MEASURE OF DISTRESS worksheet with a score of 0 submitted to social work. Also, complete NUTRITION RISK SCREEN worksheet without concerns submitted to Barbara Neff, RD 

## 2011-10-28 NOTE — Progress Notes (Signed)
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 male who was noted to have an elevated PSA of 5.35 on 08/18/2011. Short interval followup PSA remained elevated at 6.03. Consequently, he underwent transrectal ultrasound with prostate biopsies on 10/10/2011. Rectal exam prior to the ultrasound study revealed no appreciable nodules the prostate. One out of 12 core biopsy specimens was positive. Essentially, 2% of one core specimen from the right lateral base showed adenocarcinoma with a Gleason's score of 3+3.    PREVIOUS RADIATION THERAPY: No  PAST MEDICAL HISTORY:  has a past medical history of Prostate cancer (10/10/11); SBO (small bowel obstruction) (10/19/210); UTI (lower urinary tract infection); Hypertension; Chronic kidney disease; Seizures; Epilepsy; and Hyperlipidemia.    PAST SURGICAL HISTORY: Past Surgical History  Procedure Date  . Colonoscopy w/ biopsies and polypectomy 10/08/06    right terminal ileum,hemicolectomy,sessile tubovillous adenomatous polyp,no high grade dysplasia or malignancy  . Colon surgery 10/08/06    right laproscopic partial colectomy,Dr. Franky Macho  . Colonoscopy w/ biopsies 2002,2004,,2008    numerous polypectomies and colonoscopies  . Fracture surgery     right arm fracture and repair    FAMILY HISTORY: family history includes Cancer in his father, maternal uncle, and paternal aunt.  SOCIAL HISTORY:  reports that he quit smoking about 23 years ago. His smoking use included Cigarettes. He has a 25 pack-year smoking history. He has never used smokeless tobacco. He  reports that he does not drink alcohol or use illicit drugs.  ALLERGIES: Review of patient's allergies indicates no known allergies.  MEDICATIONS:  Current Outpatient Prescriptions  Medication Sig Dispense Refill  . aspirin 81 MG tablet Take 81 mg by mouth daily.      Marland Kitchen PHENObarbital (LUMINAL) 30 MG tablet Take 30 mg by mouth 3 (three) times daily. bedtime      . phenytoin (DILANTIN) 100 MG ER capsule Take 100 mg by mouth 3 (three) times daily. At bedtime      . simvastatin (ZOCOR) 10 MG tablet Take 10 mg by mouth at bedtime.      . Tamsulosin HCl (FLOMAX) 0.4 MG CAPS         REVIEW OF SYSTEMS:  A 15 point review of systems is documented in the electronic medical record. This was obtained by the nursing staff. However, I reviewed this with the patient to discuss relevant findings and make appropriate changes.  A comprehensive review of systems was negative. IPS S. score was 22. The patient denies erectile dysfunction.    PHYSICAL EXAM:  height is 5\' 10"  (1.778 m) and weight is 206 lb 11.2 oz (93.759 kg). His oral temperature is 96.4 F (35.8 C). His blood pressure is 120/75 and his pulse is 79. His respiration is 18.   Patient is in no acute distress today. Is alert and oriented. His head is normocephalic and atraumatic. Speech is fluent articulate. Gait is unremarkable. He please note the digital rectal exam findings described above.   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 He's not ideally suited for robotic-assisted laparoscopic radical prostatectomy given previous colon surgery and intraperitoneal scarring. He is also not ideally suited for prostate seed implant given his high  urinary obstructive symptom score.    PLAN:Today I reviewed the findings and workup thus far.  We discussed the natural history of prostate cancer.  We reviewed the the implications of T-stage, Gleason's Score, and PSA on decision-making and outcomes in prostate cancer.  We discussed radiation treatment in the management of prostate cancer with regard to the logistics and delivery of external beam radiation treatment as well as the logistics and delivery of prostate brachytherapy.  We compared and contrasted each of these approaches and also compared these against prostatectomy.  The patient expressed interest in external beam radiotherapy.  I filled out a patient counseling form for him with relevant treatment diagrams and we retained a copy for our records.   The patient would like to proceed with prostate IMRT.  I will share my findings with Dr. Jerre Simon and move forward with scheduling placement of three gold fiducial markers into the prostate to proceed with IMRT in the near future.     I enjoyed meeting with him today, and will look forward to participating in the care of this very nice gentleman.   I spent 60 minutes minutes face to face with the patient and more than 50% of that time was spent in counseling and/or coordination of care.   ------------------------------------------------  Artist Pais. Kathrynn Running, M.D.

## 2011-10-29 NOTE — Addendum Note (Signed)
Encounter addended by: Tessa Lerner, RN on: 10/29/2011  3:50 PM<BR>     Documentation filed: Charges VN

## 2011-10-29 NOTE — Addendum Note (Signed)
Encounter addended by: Oneita Hurt, MD on: 10/29/2011  4:00 PM<BR>     Documentation filed: Normajean Glasgow VN

## 2011-10-30 ENCOUNTER — Telehealth: Payer: Self-pay | Admitting: *Deleted

## 2011-10-30 NOTE — Telephone Encounter (Signed)
Called patient to inform of sim on 11-13-11 at 10:00 am in [place], cancelled 11-15-11 sim in [place], spoke with patient's wife and they are aware of this appt. change

## 2011-10-30 NOTE — Telephone Encounter (Signed)
CALLED PATIENT TO INFORM OF GOLD SEED PLACEMENT ON 11-11-11 AT 12;00 PM AT DR. Rudean Haskell OFFICE AND HIS SIM ON 11-15-11 AT 2:00 PM AT DR. MANNING'S OFFICE, SPOKE WITH PATIENT'S WIFE BONNIE AND THEY ARE AWARE OF THESE APPTS.

## 2011-10-31 ENCOUNTER — Telehealth: Payer: Self-pay | Admitting: *Deleted

## 2011-10-31 NOTE — Telephone Encounter (Signed)
xxxx 

## 2011-11-04 NOTE — Addendum Note (Signed)
Encounter addended by: Agnes Lawrence, RN on: 11/04/2011  4:44 PM<BR>     Documentation filed: Charges VN

## 2011-11-04 NOTE — Addendum Note (Signed)
Encounter addended by: Agnes Lawrence, RN on: 11/04/2011  4:56 PM<BR>     Documentation filed: Charges VN

## 2011-11-04 NOTE — Addendum Note (Signed)
Encounter addended by: Tessa Lerner, RN on: 11/04/2011  4:41 PM<BR>     Documentation filed: Charges VN

## 2011-11-05 ENCOUNTER — Telehealth: Payer: Self-pay | Admitting: *Deleted

## 2011-11-05 NOTE — Telephone Encounter (Signed)
xxxx 

## 2011-11-15 ENCOUNTER — Other Ambulatory Visit: Payer: Self-pay | Admitting: Radiation Oncology

## 2012-04-15 ENCOUNTER — Encounter: Payer: Self-pay | Admitting: *Deleted

## 2012-07-17 ENCOUNTER — Inpatient Hospital Stay (HOSPITAL_COMMUNITY)
Admission: EM | Admit: 2012-07-17 | Discharge: 2012-07-20 | DRG: 180 | Disposition: A | Discharge status: Home / Self Care | Payer: BC Managed Care – PPO | Attending: General Surgery | Admitting: General Surgery

## 2012-07-17 ENCOUNTER — Encounter (HOSPITAL_COMMUNITY): Payer: Self-pay | Admitting: *Deleted

## 2012-07-17 ENCOUNTER — Emergency Department (HOSPITAL_COMMUNITY): Payer: BC Managed Care – PPO

## 2012-07-17 DIAGNOSIS — N189 Chronic kidney disease, unspecified: Secondary | ICD-10-CM | POA: Diagnosis present

## 2012-07-17 DIAGNOSIS — Z8042 Family history of malignant neoplasm of prostate: Secondary | ICD-10-CM

## 2012-07-17 DIAGNOSIS — I129 Hypertensive chronic kidney disease with stage 1 through stage 4 chronic kidney disease, or unspecified chronic kidney disease: Secondary | ICD-10-CM | POA: Diagnosis present

## 2012-07-17 DIAGNOSIS — K5900 Constipation, unspecified: Secondary | ICD-10-CM | POA: Diagnosis present

## 2012-07-17 DIAGNOSIS — Z87891 Personal history of nicotine dependence: Secondary | ICD-10-CM

## 2012-07-17 DIAGNOSIS — C61 Malignant neoplasm of prostate: Secondary | ICD-10-CM | POA: Diagnosis present

## 2012-07-17 DIAGNOSIS — E785 Hyperlipidemia, unspecified: Secondary | ICD-10-CM | POA: Diagnosis present

## 2012-07-17 DIAGNOSIS — Z79899 Other long term (current) drug therapy: Secondary | ICD-10-CM

## 2012-07-17 DIAGNOSIS — Z9221 Personal history of antineoplastic chemotherapy: Secondary | ICD-10-CM

## 2012-07-17 DIAGNOSIS — K56609 Unspecified intestinal obstruction, unspecified as to partial versus complete obstruction: Principal | ICD-10-CM | POA: Diagnosis present

## 2012-07-17 DIAGNOSIS — Z923 Personal history of irradiation: Secondary | ICD-10-CM

## 2012-07-17 LAB — COMPREHENSIVE METABOLIC PANEL
ALT: 14 U/L (ref 0–53)
Albumin: 3.9 g/dL (ref 3.5–5.2)
Alkaline Phosphatase: 184 U/L — ABNORMAL HIGH (ref 39–117)
BUN: 12 mg/dL (ref 6–23)
Chloride: 99 mEq/L (ref 96–112)
Glucose, Bld: 93 mg/dL (ref 70–99)
Potassium: 4.4 mEq/L (ref 3.5–5.1)
Total Bilirubin: 0.3 mg/dL (ref 0.3–1.2)

## 2012-07-17 LAB — CBC WITH DIFFERENTIAL/PLATELET
Basophils Absolute: 0 10*3/uL (ref 0.0–0.1)
Basophils Relative: 0 % (ref 0–1)
Eosinophils Absolute: 0.1 10*3/uL (ref 0.0–0.7)
Eosinophils Relative: 1 % (ref 0–5)
Lymphocytes Relative: 10 % — ABNORMAL LOW (ref 12–46)
MCHC: 35.1 g/dL (ref 30.0–36.0)
MCV: 85.7 fL (ref 78.0–100.0)
Platelets: 165 10*3/uL (ref 150–400)
RDW: 13.1 % (ref 11.5–15.5)
WBC: 7.3 10*3/uL (ref 4.0–10.5)

## 2012-07-17 LAB — LIPASE, BLOOD: Lipase: 23 U/L (ref 11–59)

## 2012-07-17 MED ORDER — ONDANSETRON HCL 4 MG/2ML IJ SOLN
4.0000 mg | Freq: Once | INTRAMUSCULAR | Status: AC
  Administered 2012-07-17: 4 mg via INTRAVENOUS
  Filled 2012-07-17: qty 2

## 2012-07-17 MED ORDER — PANTOPRAZOLE SODIUM 40 MG IV SOLR
40.0000 mg | Freq: Every day | INTRAVENOUS | Status: DC
  Administered 2012-07-17 – 2012-07-19 (×3): 40 mg via INTRAVENOUS
  Filled 2012-07-17 (×3): qty 40

## 2012-07-17 MED ORDER — ENOXAPARIN SODIUM 40 MG/0.4ML ~~LOC~~ SOLN
40.0000 mg | SUBCUTANEOUS | Status: DC
  Administered 2012-07-17 – 2012-07-19 (×3): 40 mg via SUBCUTANEOUS
  Filled 2012-07-17 (×3): qty 0.4

## 2012-07-17 MED ORDER — MORPHINE SULFATE 4 MG/ML IJ SOLN
INTRAMUSCULAR | Status: AC
  Filled 2012-07-17: qty 1

## 2012-07-17 MED ORDER — ONDANSETRON HCL 4 MG/2ML IJ SOLN: 4.0000 mg | Freq: Four times a day (QID) | INTRAMUSCULAR | Status: DC | PRN

## 2012-07-17 MED ORDER — MORPHINE SULFATE 4 MG/ML IJ SOLN
4.0000 mg | Freq: Once | INTRAMUSCULAR | Status: AC
  Administered 2012-07-17: 4 mg via INTRAVENOUS

## 2012-07-17 MED ORDER — HYDROMORPHONE HCL PF 1 MG/ML IJ SOLN: 1.0000 mg | INTRAMUSCULAR | Status: DC | PRN

## 2012-07-17 MED ORDER — PHENYTOIN SODIUM EXTENDED 100 MG PO CAPS
300.0000 mg | ORAL_CAPSULE | Freq: Every day | ORAL | Status: DC
  Administered 2012-07-17 – 2012-07-19 (×3): 300 mg via ORAL
  Filled 2012-07-17 (×3): qty 3

## 2012-07-17 MED ORDER — MORPHINE SULFATE 4 MG/ML IJ SOLN
4.0000 mg | Freq: Once | INTRAMUSCULAR | Status: AC
  Administered 2012-07-17: 4 mg via INTRAVENOUS
  Filled 2012-07-17: qty 1

## 2012-07-17 MED ORDER — PHENOBARBITAL 97.2 MG PO TABS
97.2000 mg | ORAL_TABLET | Freq: Every day | ORAL | Status: DC
  Administered 2012-07-17 – 2012-07-19 (×3): 97.2 mg via ORAL
  Filled 2012-07-17 (×3): qty 1

## 2012-07-17 MED ORDER — LACTATED RINGERS IV SOLN
INTRAVENOUS | Status: DC
  Administered 2012-07-17 – 2012-07-19 (×5): via INTRAVENOUS

## 2012-07-17 NOTE — Progress Notes (Signed)
Instructed pt with incentive spirometry which he did very well , 2000 ml easy, tried to explain this is something we give pre op or possible surgery patents. I still think he thinks he is going to have surgery which may not be the case.

## 2012-07-17 NOTE — ED Provider Notes (Signed)
History  This chart was scribed for Joya Gaskins, MD by Bennett Scrape, ED Scribe. This patient was seen in room APA02/[facility]02 and the patient's care was started at 2:58 PM.  CSN: [id number]  Arrival date & time 07/17/12  1411   First MD Initiated Contact with Patient 07/17/12 1458      Chief Complaint  Patient presents with  . Constipation     Patient is a 67 y.o. male presenting with constipation. The history is provided by the patient. No language interpreter was used.  Constipation  The current episode started 2 days ago. The onset was gradual. The problem occurs continuously. The problem has been gradually worsening. The patient is experiencing no pain. Prior unsuccessful therapies include laxatives. Associated symptoms include abdominal pain and nausea. Pertinent negatives include no fever, no diarrhea, no vomiting, no chest pain and no coughing. The last void occurred 6 to 12 hours ago. There were no sick contacts.    IZAIHA LO is a 67 y.o. male who presents to the Emergency Department complaining of approximately 2 days of gradual onset, gradually worsening, constant constipation with associated nausea. He states that his last normal BM was 2 to 3 days ago and reports having a small BM this morning after taking a laxative. Pt reports a similar episodes diagnosed as SBO with prior admissions which began occuring after having a right partial colectomy performed by Dr. Lovell Sheehan in 2008. He denies testicular pain, emesis, CP, SOB and fever as associated symptoms. He reports undergoing 8 weeks of radiation therapy in May 2013 for prostate CA but denies being a CA pt or undergoing any recent chemotherapy treatments currently. He also has a h/o HTN, HLD and seizures which he currently takes Dilantin and phenobarb for. He is a former smoker but denies alcohol use.  PCP is Dr. Lubertha South  Past Medical History  Diagnosis Date  . Prostate cancer 10/10/11   bx=Adenocarcinoma,gleason=3+3=6,PSA=6.03,PSA on 08/18/2011=5.35  . SBO (small bowel obstruction) 10/19/210  . UTI (lower urinary tract infection)     hx  . Hypertension   . Chronic kidney disease     urolithiasis,b/l hydronephrosis  . Seizures     hx  last seizure 25 years ago  . Epilepsy     hx  . Hyperlipidemia     taking medication   . Glucose intolerance (impaired glucose tolerance)     Past Surgical History  Procedure Laterality Date  . Colonoscopy w/ biopsies and polypectomy  10/08/06    right terminal ileum,hemicolectomy,sessile tubovillous adenomatous polyp,no high grade dysplasia or malignancy  . Colon surgery  10/08/06    right laproscopic partial colectomy,Dr. Franky Macho  . Colonoscopy w/ biopsies  2002,2004,,2008    numerous polypectomies and colonoscopies  . Fracture surgery      right arm fracture and repair    Family History  Problem Relation Age of Onset  . Cancer Father     prostate  . Cancer Maternal Uncle     unknown  . Cancer Paternal Aunt     unknown origin    History  Substance Use Topics  . Smoking status: Former Smoker -- 1.00 packs/day for 25 years    Types: Cigarettes    Quit date: 05/13/1988  . Smokeless tobacco: Never Used  . Alcohol Use: No      Review of Systems  Constitutional: Negative for fever and chills.  Respiratory: Negative for cough and shortness of breath.   Cardiovascular: Negative for chest pain.  Gastrointestinal:  Positive for nausea, abdominal pain and constipation. Negative for vomiting, diarrhea and anal bleeding.  Genitourinary: Negative for dysuria and frequency.  All other systems reviewed and are negative.    Allergies  Review of patient's allergies indicates no known allergies.-confirmed by pt at bedside  Home Medications   Current Outpatient Rx  Name  Route  Sig  Dispense  Refill  . aspirin 81 MG tablet   Oral   Take 81 mg by mouth daily.         Marland Kitchen PHENObarbital (LUMINAL) 30 MG tablet   Oral    Take 30 mg by mouth 3 (three) times daily. bedtime         . phenytoin (DILANTIN) 100 MG ER capsule   Oral   Take 100 mg by mouth 3 (three) times daily. At bedtime         . simvastatin (ZOCOR) 10 MG tablet   Oral   Take 20 mg by mouth at bedtime.          . Tamsulosin HCl (FLOMAX) 0.4 MG CAPS                 Triage Vitals: BP 112/84  Pulse 88  Temp(Src) 97.2 F (36.2 C) (Oral)  Resp 20  Ht 5\' 10"  (1.778 m)  Wt 204 lb (92.534 kg)  BMI 29.27 kg/m2  SpO2 100%  Physical Exam  Nursing note and vitals reviewed.  CONSTITUTIONAL: Well developed/well nourished HEAD: Normocephalic/atraumatic EYES: EOMI/PERRL ENMT: Mucous membranes moist NECK: supple no meningeal signs SPINE:entire spine nontender CV: S1/S2 noted, no murmurs/rubs/gallops noted LUNGS: Lungs are clear to auscultation bilaterally, no apparent distress ABDOMEN: soft, moderate diffuse tenderness, no rebound or guarding GU:no cva tenderness, rectal tone is normal, no gross blood, soft brown stool present in the rectal vault, no hernia noted, no testicular tenderness, no fecal impaction NEURO: Pt is awake/alert, moves all extremitiesx4 EXTREMITIES: pulses normal, full ROM SKIN: warm, color normal PSYCH: no abnormalities of mood noted  ED Course  Procedures )  DIAGNOSTIC STUDIES: Oxygen Saturation is 100% on room air, normal by my interpretation.    COORDINATION OF CARE: 3:05 PM-Discussed treatment plan which includes pain medication, CXR, and CBC panel with pt at bedside and pt agreed to plan.   3:15 PM- Ordered 4 mg morphine injection and 4 mg Zofran injection  I spoke to dr zieglar, who will admit to hospital for SBO Pt declines NG  Tube for now    MDM  Nursing notes including past medical history and social history reviewed and considered in documentation xrays reviewed and considered Labs/vital reviewed and considered     Date: 07/17/2012  Rate: 78  Rhythm: normal sinus rhythm  QRS  Axis: normal  Intervals: normal  ST/T Wave abnormalities: nonspecific ST changes  Conduction Disutrbances:nonspecific intraventricular conduction delay  Narrative Interpretation:   Old EKG Reviewed: unchanged    I personally performed the services described in this documentation, which was scribed in my presence. The recorded information has been reviewed and is accurate.          Joya Gaskins, MD 07/17/12 1655

## 2012-07-17 NOTE — ED Notes (Signed)
States he cannot have a bowel movement, thinks it started this am, states he has a history of colon surgery

## 2012-07-17 NOTE — ED Notes (Signed)
Reports last normal BM 2 days ago; reports hx of bowel obstruction 2010; denies pain; denies n/v. Reports took laxatives this morning which resulted in small BM.

## 2012-07-17 NOTE — ED Notes (Signed)
Per Dr. Bebe Shaggy as advised by Dr. Leticia Penna, pt does not require NGT at this time d/t no vomiting; pt was requesting NGT per EDP on arrival, but after talking with pt, he agrees that he does not need NGT now.

## 2012-07-18 LAB — BASIC METABOLIC PANEL
BUN: 14 mg/dL (ref 6–23)
CO2: 27 mEq/L (ref 19–32)
Chloride: 100 mEq/L (ref 96–112)
GFR calc Af Amer: >90 mL/min (ref >90)
Potassium: 4 mEq/L (ref 3.5–5.1)

## 2012-07-18 LAB — CBC
HCT: 39.5 % (ref 39.0–52.0)
Hemoglobin: 13.5 g/dL (ref 13.0–17.0)
MCHC: 34.2 g/dL (ref 30.0–36.0)

## 2012-07-18 NOTE — Progress Notes (Signed)
Upon assessment I was not able to auscultate correct placement for NG tube.  I pulled back on the tube and advanced it but was still unable to verify placement by auscultation with 2 nurses. Pt denies sob or other s/s incorrect tube placement.  Dr. Leticia Penna notified of this via phone and he gave order to remove NG tube and leave it out.  Nursing staff to continue to monitor.

## 2012-07-18 NOTE — H&P (Signed)
Brent Suarez is an 67 y.o. male.   Chief Complaint: Nausea and vomiting HPI: Patient presented with nausea and vomiting.  Last BM several days ago.  Now having some flatus.  Abdominal pain better.  Hungry.  No fever or chills.  Prior hisroty of SBO.  Past Medical History  Diagnosis Date  . Prostate cancer 10/10/11    bx=Adenocarcinoma,gleason=3+3=6,PSA=6.03,PSA on 08/18/2011=5.35  . SBO (small bowel obstruction) 10/19/210  . UTI (lower urinary tract infection)     hx  . Hypertension   . Chronic kidney disease     urolithiasis,b/l hydronephrosis  . Seizures     hx  last seizure 25 years ago  . Epilepsy     hx  . Hyperlipidemia     taking medication   . Glucose intolerance (impaired glucose tolerance)     Past Surgical History  Procedure Laterality Date  . Colonoscopy w/ biopsies and polypectomy  10/08/06    right terminal ileum,hemicolectomy,sessile tubovillous adenomatous polyp,no high grade dysplasia or malignancy  . Colon surgery  10/08/06    right laproscopic partial colectomy,Dr. Franky Macho  . Colonoscopy w/ biopsies  2002,2004,,2008    numerous polypectomies and colonoscopies  . Fracture surgery      right arm fracture and repair    Family History  Problem Relation Age of Onset  . Cancer Father     prostate  . Cancer Maternal Uncle     unknown  . Cancer Paternal Aunt     unknown origin   Social History:  reports that he quit smoking about 24 years ago. His smoking use included Cigarettes. He has a 25 pack-year smoking history. He has never used smokeless tobacco. He reports that he does not drink alcohol or use illicit drugs.  Allergies: No Known Allergies  Medications Prior to Admission  Medication Sig Dispense Refill  . aspirin 81 MG tablet Take 81 mg by mouth every evening.       . bisacodyl (DULCOLAX) 5 MG EC tablet Take 15 mg by mouth once as needed for constipation.      . cholecalciferol (VITAMIN D) 1000 UNITS tablet Take 1,000 Units by mouth at bedtime.       Marland Kitchen PHENObarbital (LUMINAL) 30 MG tablet Take 90 mg by mouth at bedtime. bedtime      . phenytoin (DILANTIN) 100 MG ER capsule Take 300 mg by mouth at bedtime. At bedtime      . simvastatin (ZOCOR) 10 MG tablet Take 20 mg by mouth at bedtime.       . simvastatin (ZOCOR) 20 MG tablet Take 20 mg by mouth at bedtime.        Results for orders placed during the hospital encounter of 07/17/12 (from the past 48 hour(s))  COMPREHENSIVE METABOLIC PANEL     Status: Abnormal   Collection Time    07/17/12  3:09 PM      Result Value Range   Sodium 136  135 - 145 mEq/L   Potassium 4.4  3.5 - 5.1 mEq/L   Chloride 99  96 - 112 mEq/L   CO2 28  19 - 32 mEq/L   Glucose, Bld 93  70 - 99 mg/dL   BUN 12  6 - 23 mg/dL   Creatinine, Ser 1.61  0.50 - 1.35 mg/dL   Calcium 9.2  8.4 - 09.6 mg/dL   Total Protein 7.6  6.0 - 8.3 g/dL   Albumin 3.9  3.5 - 5.2 g/dL   AST 18  0 -  37 U/L   ALT 14  0 - 53 U/L   Alkaline Phosphatase 184 (*) 39 - 117 U/L   Total Bilirubin 0.3  0.3 - 1.2 mg/dL   GFR calc non Af Amer >90  >90 mL/min   GFR calc Af Amer >90  >90 mL/min   Comment:            The eGFR has been calculated     using the CKD EPI equation.     This calculation has not been     validated in all clinical     situations.     eGFR's persistently     <90 mL/min signify     possible Chronic Kidney Disease.  CBC WITH DIFFERENTIAL     Status: Abnormal   Collection Time    07/17/12  3:09 PM      Result Value Range   WBC 7.3  4.0 - 10.5 K/uL   RBC 5.25  4.22 - 5.81 MIL/uL   Hemoglobin 15.8  13.0 - 17.0 g/dL   HCT 65.7  84.6 - 96.2 %   MCV 85.7  78.0 - 100.0 fL   MCH 30.1  26.0 - 34.0 pg   MCHC 35.1  30.0 - 36.0 g/dL   RDW 95.2  84.1 - 32.4 %   Platelets 165  150 - 400 K/uL   Neutrophils Relative 83 (*) 43 - 77 %   Neutro Abs 6.1  1.7 - 7.7 K/uL   Lymphocytes Relative 10 (*) 12 - 46 %   Lymphs Abs 0.7  0.7 - 4.0 K/uL   Monocytes Relative 6  3 - 12 %   Monocytes Absolute 0.4  0.1 - 1.0 K/uL    Eosinophils Relative 1  0 - 5 %   Eosinophils Absolute 0.1  0.0 - 0.7 K/uL   Basophils Relative 0  0 - 1 %   Basophils Absolute 0.0  0.0 - 0.1 K/uL  LIPASE, BLOOD     Status: None   Collection Time    07/17/12  3:09 PM      Result Value Range   Lipase 23  11 - 59 U/L  PHENYTOIN LEVEL, TOTAL     Status: None   Collection Time    07/17/12  3:09 PM      Result Value Range   Phenytoin Lvl 10.8  10.0 - 20.0 ug/mL  LACTIC ACID, PLASMA     Status: None   Collection Time    07/17/12  3:10 PM      Result Value Range   Lactic Acid, Venous 1.4  0.5 - 2.2 mmol/L  MRSA PCR SCREENING     Status: None   Collection Time    07/17/12  6:50 PM      Result Value Range   MRSA by PCR NEGATIVE  NEGATIVE   Comment:            The GeneXpert MRSA Assay (FDA     approved for NASAL specimens     only), is one component of a     comprehensive MRSA colonization     surveillance program. It is not     intended to diagnose MRSA     infection nor to guide or     monitor treatment for     MRSA infections.  BASIC METABOLIC PANEL     Status: None   Collection Time    07/18/12  5:57 AM      Result Value Range  Sodium 135  135 - 145 mEq/L   Potassium 4.0  3.5 - 5.1 mEq/L   Chloride 100  96 - 112 mEq/L   CO2 27  19 - 32 mEq/L   Glucose, Bld 97  70 - 99 mg/dL   BUN 14  6 - 23 mg/dL   Creatinine, Ser 2.13  0.50 - 1.35 mg/dL   Calcium 8.6  8.4 - 08.6 mg/dL   GFR calc non Af Amer >90  >90 mL/min   GFR calc Af Amer >90  >90 mL/min   Comment:            The eGFR has been calculated     using the CKD EPI equation.     This calculation has not been     validated in all clinical     situations.     eGFR's persistently     <90 mL/min signify     possible Chronic Kidney Disease.  CBC     Status: Abnormal   Collection Time    07/18/12  5:57 AM      Result Value Range   WBC 4.1  4.0 - 10.5 K/uL   RBC 4.58  4.22 - 5.81 MIL/uL   Hemoglobin 13.5  13.0 - 17.0 g/dL   HCT 57.8  46.9 - 62.9 %   MCV 86.2  78.0  - 100.0 fL   MCH 29.5  26.0 - 34.0 pg   MCHC 34.2  30.0 - 36.0 g/dL   RDW 52.8  41.3 - 24.4 %   Platelets 140 (*) 150 - 400 K/uL   Dg Abd Acute W/chest  07/17/2012  *RADIOLOGY REPORT*  Clinical Data: Abdominal pain and nausea since this morning, constipation, history prostate cancer, hypertension, small bowel obstruction  ACUTE ABDOMEN SERIES (ABDOMEN 2 VIEW & CHEST 1 VIEW)  Comparison: None  Findings: Upper normal heart size. Tortuous aorta. Pulmonary vascularity normal. Minimal right basilar atelectasis. Lungs otherwise clear. No pleural effusion or pneumothorax.  Dilatation of small bowel loops in the left mid abdomen. Some scattered gas and stool present in colon. Findings compatible with small bowel obstruction, likely proximal to mid. No definite bowel wall thickening or free intraperitoneal air. Bones appear demineralized.  IMPRESSION: Dilated small bowel loops in the left mid abdomen consistent with small bowel obstruction, likely at the proximal to mid small bowel. No evidence of bowel wall thickening or perforation.   Original Report Authenticated By: Ulyses Southward, M.D.     Review of Systems  Constitutional: Positive for fever and chills.  HENT: Negative.   Eyes: Negative.   Respiratory: Negative.   Cardiovascular: Negative.   Gastrointestinal: Positive for nausea, vomiting and abdominal pain (improving). Negative for diarrhea, constipation, blood in stool and melena.  Genitourinary: Negative.   Musculoskeletal: Negative.   Skin: Negative.   Neurological: Negative.   Endo/Heme/Allergies: Negative.   Psychiatric/Behavioral: Negative.     Blood pressure 110/70, pulse 72, temperature 98.2 F (36.8 C), temperature source Oral, resp. rate 18, height 5\' 10"  (1.778 m), weight 92.534 kg (204 lb), SpO2 95.00%. Physical Exam  Constitutional: He is oriented to person, place, and time. He appears well-developed and well-nourished.  HENT:  Head: Normocephalic and atraumatic.  Eyes:  Conjunctivae and EOM are normal. Pupils are equal, round, and reactive to light. No scleral icterus.  Neck: Normal range of motion. Neck supple. No tracheal deviation present. No thyromegaly present.  Cardiovascular: Normal rate, regular rhythm and normal heart sounds.   Respiratory: Effort normal and breath  sounds normal. No respiratory distress.  GI: Soft. He exhibits no distension. There is no tenderness.  Musculoskeletal: Normal range of motion.  Lymphadenopathy:    He has no cervical adenopathy.  Neurological: He is alert and oriented to person, place, and time.  Skin: Skin is warm and dry.     Assessment/Plan Small Bowel Obstruction.  Continue IVF hydration  Clears.  Await bowel function.  Surgical options discussed with patient.  Surgical indications discussed.    ZIEGLER,BRENT C 07/18/2012, 6:58 PM

## 2012-07-19 LAB — CBC
Hemoglobin: 12.7 g/dL — ABNORMAL LOW (ref 13.0–17.0)
MCH: 29.5 pg (ref 26.0–34.0)
Platelets: 132 10*3/uL — ABNORMAL LOW (ref 150–400)
RBC: 4.31 MIL/uL (ref 4.22–5.81)

## 2012-07-19 LAB — BASIC METABOLIC PANEL
Calcium: 8.7 mg/dL (ref 8.4–10.5)
GFR calc Af Amer: >90 mL/min (ref >90)
GFR calc non Af Amer: >90 mL/min (ref >90)
Glucose, Bld: 94 mg/dL (ref 70–99)
Potassium: 3.8 mEq/L (ref 3.5–5.1)
Sodium: 136 mEq/L (ref 135–145)

## 2012-07-19 MED ORDER — SIMETHICONE 80 MG PO CHEW: 80.0000 mg | CHEWABLE_TABLET | Freq: Four times a day (QID) | ORAL | Status: DC | PRN

## 2012-07-19 NOTE — Progress Notes (Signed)
  Subjective: Still with some distention after liquids. Abdominal pain much improved. Positive bowel movement. Positive flatus. Occasional nausea.  Objective: Vital signs in last 24 hours: Temp:  [97.7 F (36.5 C)-97.9 F (36.6 C)] 97.8 F (36.6 C) (03/09 2100) Pulse Rate:  [62-75] 72 (03/09 2100) Resp:  [14-20] 20 (03/09 2100) BP: (105-115)/(69-82) 115/78 mmHg (03/09 2100) SpO2:  [94 %-97 %] 94 % (03/09 2100) Last BM Date: 07/19/12  Intake/Output from previous day: 03/08 0701 - 03/09 0700 In: 2640 [I.V.:2640] Out: -  Intake/Output this shift:    General appearance: alert and no distress GI: Intermittent bowel sounds, soft, flat, nontender.  Lab Results:   Recent Labs  07/18/12 0557 07/19/12 0553  WBC 4.1 2.9*  HGB 13.5 12.7*  HCT 39.5 37.1*  PLT 140* 132*   BMET  Recent Labs  07/18/12 0557 07/19/12 0553  NA 135 136  K 4.0 3.8  CL 100 101  CO2 27 27  GLUCOSE 97 94  BUN 14 9  CREATININE 0.80 0.74  CALCIUM 8.6 8.7   PT/INR No results found for this basename: LABPROT, INR,  in the last 72 hours ABG No results found for this basename: PHART, PCO2, PO2, HCO3,  in the last 72 hours  Studies/Results: No results found.  Anti-infectives: Anti-infectives   None      Assessment/Plan: s/p * No surgery found * Small bowel obstruction. Clinically patient does appear to be resolving slowly. Continue with advancement of diet slowly. I do suspect some partial aspect as he is still feeling some bloating after taking oral intake. Therefore I encouraged patient to resist forcing by mouth intake. We'll try full liquids and see how patient tolerates. Continue IV fluid for now. Continue ambulation.  LOS: 2 days    ZIEGLER,BRENT C 07/19/2012

## 2012-07-20 LAB — CBC
Hemoglobin: 13.5 g/dL (ref 13.0–17.0)
MCV: 85.6 fL (ref 78.0–100.0)
Platelets: 142 10*3/uL — ABNORMAL LOW (ref 150–400)
RBC: 4.57 MIL/uL (ref 4.22–5.81)
WBC: 3.4 10*3/uL — ABNORMAL LOW (ref 4.0–10.5)

## 2012-07-20 LAB — BASIC METABOLIC PANEL
CO2: 27 mEq/L (ref 19–32)
Calcium: 9 mg/dL (ref 8.4–10.5)
GFR calc non Af Amer: >90 mL/min (ref >90)
Potassium: 3.9 mEq/L (ref 3.5–5.1)
Sodium: 136 mEq/L (ref 135–145)

## 2012-07-20 NOTE — Progress Notes (Signed)
UR chart review completed.  

## 2012-07-20 NOTE — Progress Notes (Signed)
Pt discharged with instructions and verbalized understanding.  The patient ambulated off the floor per his request with staff and family in stable condition.

## 2012-08-17 ENCOUNTER — Other Ambulatory Visit: Payer: Self-pay | Admitting: Family Medicine

## 2012-09-07 NOTE — Discharge Summary (Signed)
Physician Discharge Summary  Patient ID: Brent Suarez MRN: [id number] DOB/AGE: February 11, 1946 67 y.o.  Admit date: 07/17/2012 Discharge date: 07/20/2012  Admission Diagnoses: Small bowel obstruction  Discharge Diagnoses: The same Active Problems:   * No active hospital problems. *   Discharged Condition: stable  Hospital Course: Patient presented to [facility] with nausea vomiting and diffuse abdominal pain. Evaluation was consistent with a small bowel obstruction. He was admitted to the hospital for continued conservative management with IV fluid resuscitation bowel rest. He did start passing flatus later on the day of admission and actually had a bowel movement the following day. He was advanced on his diet and tolerated advancement well. He continued have normal bowel function with resolution of the symptomatology. Resolution of a small bowel obstruction plans were made for discharge.  Consults: None  Significant Diagnostic Studies: labs: Daily  Treatments: IV hydration  Discharge Exam: Blood pressure 129/87, pulse 62, temperature 97.6 F (36.4 C), temperature source Oral, resp. rate 18, height 5\' 10"  (1.778 m), weight 92.534 kg (204 lb), SpO2 94.00%. General appearance: alert and no distress Resp: clear to auscultation bilaterally Cardio: regular rate and rhythm GI: soft, non-tender; bowel sounds normal; no masses,  no organomegaly  Disposition: 01-Home or Self Care  Discharge Orders   Future Orders Complete By Expires     Diet - low sodium heart healthy  As directed     Discharge instructions  As directed     Comments:      Increase activity as tolerated.  Low residual diet.    Increase activity slowly  As directed         Medication List    TAKE these medications       aspirin 81 MG tablet  Take 81 mg by mouth every evening.     bisacodyl 5 MG EC tablet  Commonly known as:  DULCOLAX  Take 15 mg by mouth once as needed for constipation.     cholecalciferol 1000 UNITS tablet  Commonly known as:  VITAMIN D  Take 1,000 Units by mouth at bedtime.     PHENObarbital 30 MG tablet  Commonly known as:  LUMINAL  Take 90 mg by mouth at bedtime. bedtime     simvastatin 10 MG tablet  Commonly known as:  ZOCOR  Take 20 mg by mouth at bedtime.     simvastatin 20 MG tablet  Commonly known as:  ZOCOR  Take 20 mg by mouth at bedtime.         SignedFabio Bering 09/07/2012, 11:38 AM

## 2012-10-29 ENCOUNTER — Other Ambulatory Visit: Payer: Self-pay | Admitting: Family Medicine

## 2013-01-10 ENCOUNTER — Other Ambulatory Visit: Payer: Self-pay | Admitting: Family Medicine

## 2013-02-04 ENCOUNTER — Encounter: Payer: Self-pay | Admitting: Family Medicine

## 2013-02-04 ENCOUNTER — Ambulatory Visit (INDEPENDENT_AMBULATORY_CARE_PROVIDER_SITE_OTHER): Payer: BC Managed Care – PPO | Admitting: Family Medicine

## 2013-02-04 VITALS — BP 138/88 | Ht 70.5 in | Wt 212.4 lb

## 2013-02-04 DIAGNOSIS — N400 Enlarged prostate without lower urinary tract symptoms: Secondary | ICD-10-CM | POA: Insufficient documentation

## 2013-02-04 DIAGNOSIS — E785 Hyperlipidemia, unspecified: Secondary | ICD-10-CM | POA: Insufficient documentation

## 2013-02-04 DIAGNOSIS — G40909 Epilepsy, unspecified, not intractable, without status epilepticus: Secondary | ICD-10-CM | POA: Insufficient documentation

## 2013-02-04 MED ORDER — PHENOBARBITAL 30 MG PO TABS: 90.0000 mg | ORAL_TABLET | Freq: Every day | ORAL | Status: DC

## 2013-02-04 MED ORDER — SIMVASTATIN 20 MG PO TABS: 20.0000 mg | ORAL_TABLET | Freq: Every day | ORAL | Status: DC

## 2013-02-04 MED ORDER — PHENYTOIN SODIUM EXTENDED 100 MG PO CAPS: ORAL_CAPSULE | ORAL | Status: DC

## 2013-02-04 MED ORDER — TAMSULOSIN HCL 0.4 MG PO CAPS: 0.4000 mg | ORAL_CAPSULE | Freq: Every day | ORAL | Status: DC

## 2013-02-04 NOTE — Progress Notes (Signed)
  Subjective:    Patient ID: Brent Suarez, male    DOB: [birth year], 67 y.o.   MRN: [id number]  Hyperlipidemia This is a chronic problem. The current episode started more than 1 year ago. The problem is controlled. Recent lipid tests were reviewed and are variable. He has no history of diabetes. There are no known factors aggravating his hyperlipidemia. Pertinent negatives include no chest pain. Current antihyperlipidemic treatment includes statins. The current treatment provides moderate improvement of lipids. Compliance problems include adherence to diet.  Risk factors for coronary artery disease include a sedentary lifestyle.   Patient claims compliance with his seizure medicine. Tries not to Miss a dose. Has had no further seizures. No obvious side effects from the medication.  Patient reports he is also using Flomax. This is helps his urinating. He has been under treatment for prostate cancer. He received radiation seeding. Followed by specialist. Would like a refill on the Flomax. No obvious side effects   Patient arrives for a follow up on cholesterol and seizure disorder.  Review of Systems  Cardiovascular: Negative for chest pain.   pain no back pain no change in bowel habits     Objective:   Physical Exam Alert no acute distress. HEENT normal. Lungs clear. Heart regular in rhythm. Ankles without edema. Neuro exam intact.       Assessment & Plan:  Impression 1 hyperlipidemia status uncertain. Patient has blood work from his workplace will bring in and shortly. #2 seizure disorder clinically stable on medication. #3 prostate hypertrophy stable helped by the meds no obvious side effects. Plan maintain all meds. Diet exercise discussed in encourage. Recheck every 6 months. Followup with specialist. We'll Miss exams also encourage. WSL

## 2013-04-15 ENCOUNTER — Ambulatory Visit (INDEPENDENT_AMBULATORY_CARE_PROVIDER_SITE_OTHER): Payer: Medicare Other | Admitting: Family Medicine

## 2013-04-15 ENCOUNTER — Encounter: Payer: Self-pay | Admitting: Family Medicine

## 2013-04-15 VITALS — BP 122/82 | Ht 71.0 in | Wt 215.4 lb

## 2013-04-15 DIAGNOSIS — R7301 Impaired fasting glucose: Secondary | ICD-10-CM | POA: Insufficient documentation

## 2013-04-15 DIAGNOSIS — N400 Enlarged prostate without lower urinary tract symptoms: Secondary | ICD-10-CM

## 2013-04-15 DIAGNOSIS — G40909 Epilepsy, unspecified, not intractable, without status epilepticus: Secondary | ICD-10-CM

## 2013-04-15 DIAGNOSIS — E785 Hyperlipidemia, unspecified: Secondary | ICD-10-CM | POA: Diagnosis not present

## 2013-04-15 DIAGNOSIS — R109 Unspecified abdominal pain: Secondary | ICD-10-CM | POA: Diagnosis not present

## 2013-04-15 DIAGNOSIS — R748 Abnormal levels of other serum enzymes: Secondary | ICD-10-CM

## 2013-04-15 NOTE — Progress Notes (Signed)
   Subjective:    Patient ID: Brent Suarez, male    DOB: 07-13-1945, 67 y.o.   MRN: [id number]  HPI Patient is here today to go over blood work he had done.  He states his alkaline phosphates are elevated. Patient is concerned about this. Nurse brought it up and said it could be a sign of some significant issues. Review of charts reveals the patient's alkaline phosphatase has stayed in this exact same level for 10 years and stable. Liver enzymes otherwise stable.  Work place b w: Also shows the sugar is slightly elevated at 101. Some family history diabetes. Patient admits to noncompliance her sugars in the diet  Known seizure disorder clinically stable. No further seizures. Compliant with medications.  Urinating overall is good. Sees urologist regularly. Compliant with medications in that regard.     Review of Systems No chest pain no headache no back pain no nausea no diaphoresis no change in bowel habits ROS otherwise negative    Objective:   Physical Exam  Alert no apparent distress HEENT normal neck supple lungs clear. Heart rare in rhythm. Ankles without edema. Neuro exam intact.      Assessment & Plan:  Impression 1 seizure disorder clinically stable. #2 elevated alkaline phosphatase discussed at length and chart reviewed normal for this patient. No further workup warranted. #3 impaired fasting glucose discussed need to cut down sugar diet. #4 status post prostate cancer treatment PSA remaining low. Discussed. Plan followup in 6 months. Diet exercise discussed. Maintain same meds. Cut down sugars. WSL start exercising!

## 2013-04-29 DIAGNOSIS — Z09 Encounter for follow-up examination after completed treatment for conditions other than malignant neoplasm: Secondary | ICD-10-CM | POA: Diagnosis not present

## 2013-04-29 DIAGNOSIS — Z8546 Personal history of malignant neoplasm of prostate: Secondary | ICD-10-CM | POA: Diagnosis not present

## 2013-07-12 ENCOUNTER — Other Ambulatory Visit: Payer: Self-pay | Admitting: *Deleted

## 2013-07-27 ENCOUNTER — Other Ambulatory Visit: Payer: Self-pay | Admitting: Family Medicine

## 2013-10-05 ENCOUNTER — Ambulatory Visit (INDEPENDENT_AMBULATORY_CARE_PROVIDER_SITE_OTHER): Payer: Medicare Other | Admitting: Family Medicine

## 2013-10-05 ENCOUNTER — Encounter: Payer: Self-pay | Admitting: Family Medicine

## 2013-10-05 VITALS — BP 122/80 | Temp 98.0°F | Ht 71.0 in | Wt 220.4 lb

## 2013-10-05 DIAGNOSIS — R21 Rash and other nonspecific skin eruption: Secondary | ICD-10-CM

## 2013-10-05 NOTE — Progress Notes (Signed)
   Subjective:    Patient ID: Brent Suarez, male    DOB: [birth year], 68 y.o.   MRN: [id number]  HPI Patient is here today for a tick bite on his left upper leg. It has been present for about 1 week now. Patient thinks that the tick is still in his skin.  Patient states he has no other concerns at this time.   No fever no headache no rash  Review of Systems No vomiting no diarrhea no chest pain ROS otherwise negative    Objective:   Physical Exam  Alert no apparent distress. Lungs clear. Heart regular in rhythm. Left anterior thigh small rash not particularly tender with central excoriation no tick evident discussed      Assessment & Plan:  Impression tick bite with multiple concerns multiple questions answered plan symptomatic care only. Warning signs discussed. WSL

## 2013-12-14 ENCOUNTER — Other Ambulatory Visit: Payer: Self-pay | Admitting: Family Medicine

## 2013-12-29 DIAGNOSIS — C61 Malignant neoplasm of prostate: Secondary | ICD-10-CM | POA: Diagnosis not present

## 2013-12-29 DIAGNOSIS — Z923 Personal history of irradiation: Secondary | ICD-10-CM | POA: Diagnosis not present

## 2014-02-02 ENCOUNTER — Telehealth: Payer: Self-pay | Admitting: *Deleted

## 2014-02-02 NOTE — Telephone Encounter (Signed)
Request from  [facility] phenobarbital 32.4mg  tab. Last seen 10/05/13 for rash. Last seen for seizures on 04/15/13

## 2014-02-02 NOTE — Telephone Encounter (Signed)
[place] for three mo

## 2014-02-03 ENCOUNTER — Other Ambulatory Visit: Payer: Self-pay | Admitting: *Deleted

## 2014-02-03 MED ORDER — PHENOBARBITAL 30 MG PO TABS: 90.0000 mg | ORAL_TABLET | Freq: Every day | ORAL | Status: DC

## 2014-02-11 ENCOUNTER — Telehealth: Payer: Self-pay | Admitting: Family Medicine

## 2014-02-11 MED ORDER — PHENOBARBITAL 30 MG PO TABS: 90.0000 mg | ORAL_TABLET | Freq: Every day | ORAL | Status: DC

## 2014-02-11 NOTE — Telephone Encounter (Signed)
ok 

## 2014-02-11 NOTE — Telephone Encounter (Signed)
Brent Suarez (Script waiting to be signed and will be faxed to pharmacy by the end of the day)

## 2014-02-11 NOTE — Telephone Encounter (Signed)
Patient notified

## 2014-02-11 NOTE — Telephone Encounter (Signed)
PHENObarbital (LUMINAL) 30 MG tablet  Pt waiting on this mail order to come but they have not  Received it yet an he is out. Is this a med he needs to have,  If so can we call him in a 30 day supply to   [facility]

## 2014-02-17 DIAGNOSIS — Z23 Encounter for immunization: Secondary | ICD-10-CM | POA: Diagnosis not present

## 2014-04-12 ENCOUNTER — Other Ambulatory Visit: Payer: Self-pay | Admitting: Family Medicine

## 2014-04-12 ENCOUNTER — Ambulatory Visit (INDEPENDENT_AMBULATORY_CARE_PROVIDER_SITE_OTHER): Payer: Medicare Other | Admitting: Urology

## 2014-04-12 DIAGNOSIS — N1330 Unspecified hydronephrosis: Secondary | ICD-10-CM

## 2014-04-12 DIAGNOSIS — C61 Malignant neoplasm of prostate: Secondary | ICD-10-CM | POA: Diagnosis not present

## 2014-04-12 MED ORDER — PHENYTOIN SODIUM EXTENDED 100 MG PO CAPS: 100.0000 mg | ORAL_CAPSULE | Freq: Three times a day (TID) | ORAL | Status: DC

## 2014-04-12 NOTE — Addendum Note (Signed)
Addended by: Jesusita Oka on: 04/12/2014 02:23 PM   Modules accepted: Orders

## 2014-04-12 NOTE — Telephone Encounter (Signed)
Sent in 30 day supply only because patient needs office visit. Patient verbalized understanding and was transferred to front desk to schedule appointment.

## 2014-04-12 NOTE — Telephone Encounter (Signed)
Patient needs refill on dilantin 100 mg called into [facility].

## 2014-04-13 ENCOUNTER — Ambulatory Visit (INDEPENDENT_AMBULATORY_CARE_PROVIDER_SITE_OTHER): Payer: Medicare Other | Admitting: Family Medicine

## 2014-04-13 ENCOUNTER — Encounter: Payer: Self-pay | Admitting: Family Medicine

## 2014-04-13 VITALS — BP 112/64 | Temp 98.4°F | Ht 70.0 in | Wt 219.0 lb

## 2014-04-13 DIAGNOSIS — L259 Unspecified contact dermatitis, unspecified cause: Secondary | ICD-10-CM | POA: Diagnosis not present

## 2014-04-13 MED ORDER — METHYLPREDNISOLONE ACETATE 40 MG/ML IJ SUSP
40.0000 mg | Freq: Once | INTRAMUSCULAR | Status: AC
  Administered 2014-04-13: 40 mg via INTRAMUSCULAR

## 2014-04-13 MED ORDER — PREDNISONE 20 MG PO TABS: ORAL_TABLET | ORAL | Status: DC

## 2014-04-13 NOTE — Progress Notes (Signed)
   Subjective:    Patient ID: Brent Suarez, male    DOB: Sep 13, 1945, 68 y.o.   MRN: [id number]  Rash This is a new problem. The current episode started 1 to 4 weeks ago. Pain location: arms and stomach. The rash is characterized by burning. Associated with: poison oak.    Significant itching some oozing significant redness burning of the rash  Review of Systems  Skin: Positive for rash.       Objective:   Physical Exam  Extensive contact dermatitis on the arms abdomen and hands.      Assessment & Plan:  Contact dermatitis extensive prednisone taper plus Depo-Medrol shot warning signs regarding secondary infection were discussed

## 2014-04-18 ENCOUNTER — Other Ambulatory Visit: Payer: Self-pay | Admitting: *Deleted

## 2014-04-18 MED ORDER — PHENOBARBITAL 30 MG PO TABS: 90.0000 mg | ORAL_TABLET | Freq: Every day | ORAL | Status: DC

## 2014-04-28 ENCOUNTER — Ambulatory Visit (INDEPENDENT_AMBULATORY_CARE_PROVIDER_SITE_OTHER): Payer: Medicare Other | Admitting: Family Medicine

## 2014-04-28 ENCOUNTER — Encounter: Payer: Self-pay | Admitting: Family Medicine

## 2014-04-28 VITALS — BP 132/80 | Ht 70.0 in | Wt 220.0 lb

## 2014-04-28 DIAGNOSIS — R7301 Impaired fasting glucose: Secondary | ICD-10-CM | POA: Diagnosis not present

## 2014-04-28 DIAGNOSIS — E785 Hyperlipidemia, unspecified: Secondary | ICD-10-CM

## 2014-04-28 DIAGNOSIS — Z79899 Other long term (current) drug therapy: Secondary | ICD-10-CM | POA: Diagnosis not present

## 2014-04-28 DIAGNOSIS — G40909 Epilepsy, unspecified, not intractable, without status epilepticus: Secondary | ICD-10-CM | POA: Diagnosis not present

## 2014-04-28 DIAGNOSIS — N400 Enlarged prostate without lower urinary tract symptoms: Secondary | ICD-10-CM

## 2014-04-28 MED ORDER — PHENYTOIN SODIUM EXTENDED 100 MG PO CAPS: 100.0000 mg | ORAL_CAPSULE | Freq: Three times a day (TID) | ORAL | Status: DC

## 2014-04-28 MED ORDER — TAMSULOSIN HCL 0.4 MG PO CAPS: 0.4000 mg | ORAL_CAPSULE | Freq: Every day | ORAL | Status: DC

## 2014-04-28 MED ORDER — PHENOBARBITAL 30 MG PO TABS: 90.0000 mg | ORAL_TABLET | Freq: Every day | ORAL | Status: DC

## 2014-04-28 MED ORDER — SIMVASTATIN 20 MG PO TABS: 20.0000 mg | ORAL_TABLET | Freq: Every day | ORAL | Status: DC

## 2014-04-28 NOTE — Progress Notes (Signed)
   Subjective:    Patient ID: Brent Suarez, male    DOB: Apr 17, 1946, 68 y.o.   MRN: [id number]  Hyperlipidemia This is a chronic problem. The current episode started more than 1 year ago. Treatments tried: zocor. Compliance problems include adherence to exercise.     Pt concerned about abdomen. Pt states every am it looks like a ridge sticking up in middle of abdomen. No pain.   History of seizure disorder. Long-term management with phenobarbital and Dilantin. No seizure for years and patient definitely wants to stand medication.  Patient has ongoing urinary outflow difficulties. Flomax helps with this. Urologist falls and specifically for the prostate cancer. Next  Challenges with impaired fasting glucose. Status uncertain. Review of Systems No headache no chest pain no back pain abdominal pain no change in bowel habits no blood in stool    Objective:   Physical Exam Alert no apparent distress vital stable blood pressure good. HEENT normal. Lungs clear. Heart regular in rhythm. Neuro exam intact. Normal thinning of linea alba abdominal musculature      Assessment & Plan:  Impression 1 seizure disorder apparent good control. #2 hyperlipidemia status uncertain. #3 impaired fasting glucose status uncertain. #4 see present illness mid abdominal prominences normal some thinning of abdominal musculature discussed plan appropriate blood work. Meds refilled. Diet exercise discussed. WSL check every 6 months

## 2014-05-01 LAB — LIPID PANEL
Cholesterol: 166 mg/dL (ref 0–200)
HDL: 33 mg/dL — ABNORMAL LOW (ref >39)
LDL CALC: 96 mg/dL (ref 0–99)
TRIGLYCERIDES: 187 mg/dL — AB (ref <150)
Total CHOL/HDL Ratio: 5 Ratio
VLDL: 37 mg/dL (ref 0–40)

## 2014-05-01 LAB — BASIC METABOLIC PANEL
BUN: 15 mg/dL (ref 6–23)
CO2: 26 mEq/L (ref 19–32)
CREATININE: 0.83 mg/dL (ref 0.50–1.35)
Calcium: 9.2 mg/dL (ref 8.4–10.5)
Chloride: 103 mEq/L (ref 96–112)
GLUCOSE: 99 mg/dL (ref 70–99)
POTASSIUM: 4.4 meq/L (ref 3.5–5.3)
Sodium: 138 mEq/L (ref 135–145)

## 2014-05-01 LAB — HEPATIC FUNCTION PANEL
ALK PHOS: 159 U/L — AB (ref 39–117)
ALT: 18 U/L (ref 0–53)
AST: 21 U/L (ref 0–37)
Albumin: 4.2 g/dL (ref 3.5–5.2)
BILIRUBIN DIRECT: 0.1 mg/dL (ref 0.0–0.3)
BILIRUBIN INDIRECT: 0.3 mg/dL (ref 0.2–1.2)
BILIRUBIN TOTAL: 0.4 mg/dL (ref 0.2–1.2)
Total Protein: 7.4 g/dL (ref 6.0–8.3)

## 2014-05-08 ENCOUNTER — Encounter: Payer: Self-pay | Admitting: Family Medicine

## 2014-07-31 ENCOUNTER — Other Ambulatory Visit: Payer: Self-pay | Admitting: Family Medicine

## 2014-10-01 ENCOUNTER — Other Ambulatory Visit: Payer: Self-pay | Admitting: Family Medicine

## 2014-10-18 ENCOUNTER — Other Ambulatory Visit: Payer: Self-pay | Admitting: Family Medicine

## 2014-11-01 ENCOUNTER — Ambulatory Visit (INDEPENDENT_AMBULATORY_CARE_PROVIDER_SITE_OTHER): Payer: Medicare Other | Admitting: Family Medicine

## 2014-11-01 ENCOUNTER — Encounter: Payer: Self-pay | Admitting: Family Medicine

## 2014-11-01 VITALS — BP 114/84 | Ht 68.5 in | Wt 217.1 lb

## 2014-11-01 DIAGNOSIS — Z23 Encounter for immunization: Secondary | ICD-10-CM

## 2014-11-01 DIAGNOSIS — G40909 Epilepsy, unspecified, not intractable, without status epilepticus: Secondary | ICD-10-CM

## 2014-11-01 DIAGNOSIS — E785 Hyperlipidemia, unspecified: Secondary | ICD-10-CM

## 2014-11-01 DIAGNOSIS — Z13220 Encounter for screening for lipoid disorders: Secondary | ICD-10-CM | POA: Diagnosis not present

## 2014-11-01 DIAGNOSIS — Z0000 Encounter for general adult medical examination without abnormal findings: Secondary | ICD-10-CM

## 2014-11-01 DIAGNOSIS — R7301 Impaired fasting glucose: Secondary | ICD-10-CM

## 2014-11-01 DIAGNOSIS — C61 Malignant neoplasm of prostate: Secondary | ICD-10-CM

## 2014-11-01 MED ORDER — PHENYTOIN SODIUM EXTENDED 100 MG PO CAPS: ORAL_CAPSULE | ORAL | Status: DC

## 2014-11-01 MED ORDER — TAMSULOSIN HCL 0.4 MG PO CAPS: ORAL_CAPSULE | ORAL | Status: DC

## 2014-11-01 MED ORDER — SIMVASTATIN 20 MG PO TABS: 20.0000 mg | ORAL_TABLET | Freq: Every day | ORAL | Status: DC

## 2014-11-01 MED ORDER — PHENOBARBITAL 30 MG PO TABS: 90.0000 mg | ORAL_TABLET | Freq: Every day | ORAL | Status: DC

## 2014-11-01 NOTE — Progress Notes (Signed)
   Subjective:    Patient ID: Brent Suarez, male    DOB: [birth year], 69 y.o.   MRN: [id number]  HPI AWV- Annual Wellness Visit  The patient was seen for their annual wellness visit. The patient's past medical history, surgical history, and family history were reviewed. Pertinent vaccines were reviewed ( tetanus, pneumonia, shingles, flu) The patient's medication list was reviewed and updated.  The height and weight were entered. The patient's current BMI is: 32.53  Cognitive screening was completed. Outcome of Mini - Cog: passed Falls within the past 6 months: none   Current tobacco usage: none (All patients who use tobacco were given written and verbal information on quitting)  Recent listing of emergency department/hospitalizations over the past year were reviewed.  current specialist the patient sees on a regular basis: Oncology   Medicare annual wellness visit patient questionnaire was reviewed.  A written screening schedule for the patient for the next 5-10 years was given. Appropriate discussion of followup regarding next visit was discussed.  Patient has no concerns at this time. Patient states it has been many years since he had a colonoscopy done.    Taking chol med faithfully, no side effects,  No seizure  Urinating fine   Review of Systems  Constitutional: Negative for fever, activity change and appetite change.  HENT: Negative for congestion and rhinorrhea.   Eyes: Negative for discharge.  Respiratory: Negative for cough and wheezing.   Cardiovascular: Negative for chest pain.  Gastrointestinal: Negative for vomiting, abdominal pain and blood in stool.  Genitourinary: Negative for frequency and difficulty urinating.  Musculoskeletal: Negative for neck pain.  Skin: Negative for rash.  Allergic/Immunologic: Negative for environmental allergies and food allergies.  Neurological: Negative for weakness and headaches.  Psychiatric/Behavioral: Negative for  agitation.       Objective:   Physical Exam  Constitutional: He appears well-developed and well-nourished.  Obesity present. Breast hypertrophy noted  HENT:  Head: Normocephalic and atraumatic.  Right Ear: External ear normal.  Left Ear: External ear normal.  Nose: Nose normal.  Mouth/Throat: Oropharynx is clear and moist.  Eyes: EOM are normal. Pupils are equal, round, and reactive to light.  Neck: Normal range of motion. Neck supple. No thyromegaly present.  Cardiovascular: Normal rate, regular rhythm and normal heart sounds.   No murmur heard. Pulmonary/Chest: Effort normal and breath sounds normal. No respiratory distress. He has no wheezes.  Abdominal: Soft. Bowel sounds are normal. He exhibits no distension and no mass. There is no tenderness.  Abdominal scar abdominal wall hernia  Genitourinary: Penis normal.  Musculoskeletal: Normal range of motion. He exhibits no edema.  Lymphadenopathy:    He has no cervical adenopathy.  Neurological: He is alert. He exhibits normal muscle tone.  Skin: Skin is warm and dry. No erythema.  Psychiatric: He has a normal mood and affect. His behavior is normal. Judgment normal.  Vitals reviewed.         Assessment & Plan:  Impression 1 wellness exam #2 seizure disorder stable #3 hyperlipidemia status uncertain #4 status post prostate cancer further history has not seen his urologist for over a year plan given colonoscopy sheet strongly encouraged to contact Dr. Melony Overly. 18 all medications. Diet exercise discussed. Prevnar injection today WSL

## 2014-12-28 DIAGNOSIS — C61 Malignant neoplasm of prostate: Secondary | ICD-10-CM | POA: Diagnosis not present

## 2015-01-18 DIAGNOSIS — Z923 Personal history of irradiation: Secondary | ICD-10-CM | POA: Diagnosis not present

## 2015-01-18 DIAGNOSIS — C61 Malignant neoplasm of prostate: Secondary | ICD-10-CM | POA: Diagnosis not present

## 2015-01-19 DIAGNOSIS — Z0000 Encounter for general adult medical examination without abnormal findings: Secondary | ICD-10-CM | POA: Diagnosis not present

## 2015-01-19 DIAGNOSIS — E785 Hyperlipidemia, unspecified: Secondary | ICD-10-CM | POA: Diagnosis not present

## 2015-01-19 DIAGNOSIS — C61 Malignant neoplasm of prostate: Secondary | ICD-10-CM | POA: Diagnosis not present

## 2015-01-19 DIAGNOSIS — Z13220 Encounter for screening for lipoid disorders: Secondary | ICD-10-CM | POA: Diagnosis not present

## 2015-01-20 LAB — BASIC METABOLIC PANEL
BUN/Creatinine Ratio: 13 (ref 10–22)
BUN: 11 mg/dL (ref 8–27)
CO2: 24 mmol/L (ref 18–29)
Calcium: 9.1 mg/dL (ref 8.6–10.2)
Chloride: 101 mmol/L (ref 97–108)
Creatinine, Ser: 0.82 mg/dL (ref 0.76–1.27)
GFR calc Af Amer: 106 mL/min/{1.73_m2} (ref >59)
GFR calc non Af Amer: 92 mL/min/{1.73_m2} (ref >59)
GLUCOSE: 106 mg/dL — AB (ref 65–99)
POTASSIUM: 4.7 mmol/L (ref 3.5–5.2)
Sodium: 140 mmol/L (ref 134–144)

## 2015-01-20 LAB — LIPID PANEL
CHOLESTEROL TOTAL: 149 mg/dL (ref 100–199)
Chol/HDL Ratio: 4.8 ratio units (ref 0.0–5.0)
HDL: 31 mg/dL — ABNORMAL LOW (ref >39)
LDL Calculated: 77 mg/dL (ref 0–99)
Triglycerides: 205 mg/dL — ABNORMAL HIGH (ref 0–149)
VLDL CHOLESTEROL CAL: 41 mg/dL — AB (ref 5–40)

## 2015-01-20 LAB — HEPATIC FUNCTION PANEL
ALBUMIN: 4.5 g/dL (ref 3.6–4.8)
ALT: 17 IU/L (ref 0–44)
AST: 22 IU/L (ref 0–40)
Alkaline Phosphatase: 191 IU/L — ABNORMAL HIGH (ref 39–117)
Bilirubin Total: 0.3 mg/dL (ref 0.0–1.2)
Bilirubin, Direct: 0.1 mg/dL (ref 0.00–0.40)
Total Protein: 7.4 g/dL (ref 6.0–8.5)

## 2015-01-20 LAB — PSA, TOTAL AND FREE
PROSTATE SPECIFIC AG, SERUM: 0.3 ng/mL (ref 0.0–4.0)
PSA FREE PCT: 13.3 %
PSA FREE: 0.04 ng/mL

## 2015-01-31 ENCOUNTER — Other Ambulatory Visit: Payer: Self-pay

## 2015-01-31 ENCOUNTER — Ambulatory Visit (INDEPENDENT_AMBULATORY_CARE_PROVIDER_SITE_OTHER): Payer: Medicare Other | Admitting: Nurse Practitioner

## 2015-01-31 ENCOUNTER — Encounter: Payer: Self-pay | Admitting: Nurse Practitioner

## 2015-01-31 VITALS — BP 107/77 | HR 87 | Temp 97.2°F | Ht 70.0 in | Wt 218.0 lb

## 2015-01-31 DIAGNOSIS — Z85038 Personal history of other malignant neoplasm of large intestine: Secondary | ICD-10-CM

## 2015-01-31 MED ORDER — PEG 3350-KCL-NA BICARB-NACL 420 G PO SOLR: 4000.0000 mL | Freq: Once | ORAL | Status: DC

## 2015-01-31 NOTE — Patient Instructions (Signed)
1. We will schedule your procedure for you. 2. Further recommendations to be based on results of your procedure. 

## 2015-01-31 NOTE — Assessment & Plan Note (Signed)
Patient with a history of large polyps on his last 2 colonoscopies. His last colonoscopy the entire polyp cannot be removed and when given options he elected for partial colectomy. 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 Recommended repeat colonoscopy 5 years after (2013). However this is never done. He now presents for overdue surveillance colonoscopy status post colon cancer and right partial colectomy. His currently a symptomatically GI standpoint.  Proceed with TCS with Dr. Gala Romney in near future: the risks, benefits, and alternatives have been discussed with the patient in detail. The patient states understanding and desires to proceed.  Patient has a history of seizures and is on phenobarbital and Dilantin. No anticoagulants, no chronic pain medications, no chronic anxiolytics. He is on the same medication regimen is last colonoscopy which was successfully completed with conscious sedation and this should likely be adequate for this procedure.

## 2015-01-31 NOTE — Progress Notes (Signed)
Primary Care Physician:  Mickie Hillier, MD Primary Gastroenterologist:  Dr. Gala Romney  Chief Complaint  Patient presents with  . Colonoscopy    HPI:   69 year old male presents for surveillance colonoscopy. Last colonoscopy 09/18/2006 for a history of large transverse colon polyp removed piecemeal in 2004. Findings of his colonoscopy in 2000 include large proximal transverse colon which is only able to be partially removed and tattooed. He elected for surgical resection which was completed the end of May 2008. Surgical pathology of the polypectomy showed tubular adenoma. 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 Surgical resection for colon neoplasm was conscious by Dr. Arnoldo Morale on 10/08/2006 and was a hand-assisted laparoscopic partial right colectomy. After his last colonoscopy was recommended for a repeat in 5 years. At this point he is overdue.  Today he denies abdominal pain, N/V, fever, chills, unintentional weight. Denies hematochezia, melena, dysphagia. Thinks his vitamin has iron in it and notes stools are darker. Has not had any seizures in 30 years. Denies chest pain, dyspnea, dizziness, lightheadedness, syncope, near syncope. Denies any other upper or lower GI symptoms.  Past Medical History  Diagnosis Date  . Prostate cancer 10/10/11    bx=Adenocarcinoma,gleason=3+3=6,PSA=6.03,PSA on 08/18/2011=5.35  . SBO (small bowel obstruction) 10/19/210  . UTI (lower urinary tract infection)     hx  . Hypertension   . Chronic kidney disease     urolithiasis,b/l hydronephrosis  . Seizures     hx  last seizure 25 years ago  . Epilepsy     hx  . Hyperlipidemia     taking medication   . Glucose intolerance (impaired glucose tolerance)     Past Surgical History  Procedure Laterality Date  . Colonoscopy w/ biopsies and polypectomy  10/08/06    right terminal ileum,hemicolectomy,sessile tubovillous adenomatous polyp,no high grade dysplasia or malignancy  .  Colon surgery  10/08/06    right laproscopic partial colectomy,Dr. Aviva Signs  . Colonoscopy w/ biopsies  2002,2004,,2008    numerous polypectomies and colonoscopies  . Fracture surgery      right arm fracture and repair    Current Outpatient Prescriptions  Medication Sig Dispense Refill  . aspirin 81 MG tablet Take 81 mg by mouth every evening.     . cholecalciferol (VITAMIN D) 1000 UNITS tablet Take 1,000 Units by mouth at bedtime.    . Multiple Vitamin (MULTIVITAMIN) capsule Take 1 capsule by mouth daily.    Marland Kitchen PHENObarbital (LUMINAL) 30 MG tablet Take 3 tablets (90 mg total) by mouth at bedtime. bedtime 270 tablet 1  . phenytoin (DILANTIN) 100 MG ER capsule TAKE 1 CAPSULE THREE TIMES DAILY 270 capsule 1  . simvastatin (ZOCOR) 20 MG tablet Take 1 tablet (20 mg total) by mouth daily. 90 tablet 1  . tamsulosin (FLOMAX) 0.4 MG CAPS capsule TAKE 1 CAPSULE EVERY DAY  AFTER  SUPPER 90 capsule 1   No current facility-administered medications for this visit.    Allergies as of 01/31/2015  . (No Known Allergies)    Family History  Problem Relation Age of Onset  . Cancer Father     prostate  . Cancer Maternal Uncle     unknown  . Cancer Paternal Aunt     unknown origin    Social History   Social History  . Marital Status: Married    Spouse Name: N/A  . Number of Children: 2  . Years of Education: N/A   Occupational History  .  [employer]  Social History Main Topics  . Smoking status: Former Smoker -- 1.00 packs/day for 25 years    Types: Cigarettes    Quit date: 05/13/1988  . Smokeless tobacco: Never Used  . Alcohol Use: No  . Drug Use: No     Comment: quit smoking 25 years agi  . Sexual Activity: Not on file   Other Topics Concern  . Not on file   Social History Narrative    Review of Systems: General: Negative for anorexia, weight loss, fever, chills, fatigue, weakness. Eyes: Negative for vision changes.  ENT: Negative for hoarseness, difficulty  swallowing. CV: Negative for chest pain, angina, palpitations, peripheral edema.  Respiratory: Negative for dyspnea at rest, cough, sputum, wheezing.  GI: See history of present illness. MS: Negative for joint pain, low back pain.  Derm: Negative for rash or itching.  Endo: Negative for unusual weight change.  Heme: Negative for bruising or bleeding. Allergy: Negative for rash or hives.    Physical Exam: BP 107/77 mmHg  Pulse 87  Temp(Src) 97.2 F (36.2 C) (Oral)  Ht 5\' 10"  (1.778 m)  Wt 218 lb (98.884 kg)  BMI 31.28 kg/m2 General:   Alert and oriented. Pleasant and cooperative. Well-nourished and well-developed.  Head:  Normocephalic and atraumatic. Eyes:  Without icterus, sclera clear and conjunctiva pink.  Ears:  Normal auditory acuity. Cardiovascular:  S1, S2 present without murmurs appreciated. Normal pulses noted. Extremities without clubbing or edema. Respiratory:  Clear to auscultation bilaterally. No wheezes, rales, or rhonchi. No distress.  Gastrointestinal:  +BS, rounded but soft, non-tender and non-distended. No HSM noted. No guarding or rebound. No masses appreciated.  Rectal:  Deferred  Skin:  Intact without significant lesions or rashes. Neurologic:  Alert and oriented x4;  grossly normal neurologically. Psych:  Alert and cooperative. Normal mood and affect.    01/31/2015 10:59 AM

## 2015-01-31 NOTE — Progress Notes (Signed)
CC'ED TO PCP 

## 2015-02-17 ENCOUNTER — Encounter (HOSPITAL_COMMUNITY): Payer: Self-pay | Admitting: *Deleted

## 2015-02-17 ENCOUNTER — Ambulatory Visit (HOSPITAL_COMMUNITY)
Admission: RE | Admit: 2015-02-17 | Discharge: 2015-02-17 | Disposition: A | Discharge status: Home / Self Care | Payer: Medicare Other | Source: Ambulatory Visit | Attending: Internal Medicine | Admitting: Internal Medicine

## 2015-02-17 ENCOUNTER — Encounter (HOSPITAL_COMMUNITY)
Admission: RE | Disposition: A | Discharge status: Home / Self Care | Payer: Self-pay | Source: Ambulatory Visit | Attending: Internal Medicine

## 2015-02-17 DIAGNOSIS — Z79899 Other long term (current) drug therapy: Secondary | ICD-10-CM | POA: Insufficient documentation

## 2015-02-17 DIAGNOSIS — Z1211 Encounter for screening for malignant neoplasm of colon: Secondary | ICD-10-CM | POA: Insufficient documentation

## 2015-02-17 DIAGNOSIS — Z8546 Personal history of malignant neoplasm of prostate: Secondary | ICD-10-CM | POA: Insufficient documentation

## 2015-02-17 DIAGNOSIS — K5730 Diverticulosis of large intestine without perforation or abscess without bleeding: Secondary | ICD-10-CM | POA: Diagnosis not present

## 2015-02-17 DIAGNOSIS — Z9049 Acquired absence of other specified parts of digestive tract: Secondary | ICD-10-CM | POA: Insufficient documentation

## 2015-02-17 DIAGNOSIS — Z7982 Long term (current) use of aspirin: Secondary | ICD-10-CM | POA: Insufficient documentation

## 2015-02-17 DIAGNOSIS — K639 Disease of intestine, unspecified: Secondary | ICD-10-CM | POA: Diagnosis not present

## 2015-02-17 DIAGNOSIS — E785 Hyperlipidemia, unspecified: Secondary | ICD-10-CM | POA: Diagnosis not present

## 2015-02-17 DIAGNOSIS — Z87891 Personal history of nicotine dependence: Secondary | ICD-10-CM | POA: Diagnosis not present

## 2015-02-17 DIAGNOSIS — I129 Hypertensive chronic kidney disease with stage 1 through stage 4 chronic kidney disease, or unspecified chronic kidney disease: Secondary | ICD-10-CM | POA: Diagnosis not present

## 2015-02-17 DIAGNOSIS — N189 Chronic kidney disease, unspecified: Secondary | ICD-10-CM | POA: Insufficient documentation

## 2015-02-17 DIAGNOSIS — Z85038 Personal history of other malignant neoplasm of large intestine: Secondary | ICD-10-CM

## 2015-02-17 DIAGNOSIS — K529 Noninfective gastroenteritis and colitis, unspecified: Secondary | ICD-10-CM | POA: Diagnosis not present

## 2015-02-17 HISTORY — PX: COLONOSCOPY: SHX5424

## 2015-02-17 SURGERY — COLONOSCOPY
Anesthesia: Moderate Sedation

## 2015-02-17 MED ORDER — MEPERIDINE HCL 100 MG/ML IJ SOLN
INTRAMUSCULAR | Status: DC | PRN
  Administered 2015-02-17: 25 mg via INTRAVENOUS
  Administered 2015-02-17: 50 mg via INTRAVENOUS

## 2015-02-17 MED ORDER — STERILE WATER FOR IRRIGATION IR SOLN
Status: DC | PRN
  Administered 2015-02-17: 10:00:00

## 2015-02-17 MED ORDER — MIDAZOLAM HCL 5 MG/5ML IJ SOLN
INTRAMUSCULAR | Status: DC | PRN
  Administered 2015-02-17: 1 mg via INTRAVENOUS
  Administered 2015-02-17: 2 mg via INTRAVENOUS

## 2015-02-17 MED ORDER — SODIUM CHLORIDE 0.9 % IV SOLN
INTRAVENOUS | Status: DC
  Administered 2015-02-17: 1000 mL via INTRAVENOUS

## 2015-02-17 MED ORDER — ONDANSETRON HCL 4 MG/2ML IJ SOLN
INTRAMUSCULAR | Status: AC
  Filled 2015-02-17: qty 2

## 2015-02-17 MED ORDER — MIDAZOLAM HCL 5 MG/5ML IJ SOLN
INTRAMUSCULAR | Status: AC
  Filled 2015-02-17: qty 10

## 2015-02-17 MED ORDER — MEPERIDINE HCL 100 MG/ML IJ SOLN
INTRAMUSCULAR | Status: AC
  Filled 2015-02-17: qty 2

## 2015-02-17 MED ORDER — ONDANSETRON HCL 4 MG/2ML IJ SOLN
INTRAMUSCULAR | Status: DC | PRN
  Administered 2015-02-17: 4 mg via INTRAVENOUS

## 2015-02-17 NOTE — H&P (View-Only) (Signed)
Primary Care Physician:  Mickie Hillier, MD Primary Gastroenterologist:  Dr. Gala Romney  Chief Complaint  Patient presents with  . Colonoscopy    HPI:   69 year old male presents for surveillance colonoscopy. Last colonoscopy 09/18/2006 for a history of large transverse colon polyp removed piecemeal in 2004. Findings of his colonoscopy in 2000 include large proximal transverse colon which is only able to be partially removed and tattooed. He elected for surgical resection which was completed the end of May 2008. Surgical pathology of the polypectomy showed tubular adenoma. 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 Surgical resection for colon neoplasm was conscious by Dr. Arnoldo Morale on 10/08/2006 and was a hand-assisted laparoscopic partial right colectomy. After his last colonoscopy was recommended for a repeat in 5 years. At this point he is overdue.  Today he denies abdominal pain, N/V, fever, chills, unintentional weight. Denies hematochezia, melena, dysphagia. Thinks his vitamin has iron in it and notes stools are darker. Has not had any seizures in 30 years. Denies chest pain, dyspnea, dizziness, lightheadedness, syncope, near syncope. Denies any other upper or lower GI symptoms.  Past Medical History  Diagnosis Date  . Prostate cancer 10/10/11    bx=Adenocarcinoma,gleason=3+3=6,PSA=6.03,PSA on 08/18/2011=5.35  . SBO (small bowel obstruction) 10/19/210  . UTI (lower urinary tract infection)     hx  . Hypertension   . Chronic kidney disease     urolithiasis,b/l hydronephrosis  . Seizures     hx  last seizure 25 years ago  . Epilepsy     hx  . Hyperlipidemia     taking medication   . Glucose intolerance (impaired glucose tolerance)     Past Surgical History  Procedure Laterality Date  . Colonoscopy w/ biopsies and polypectomy  10/08/06    right terminal ileum,hemicolectomy,sessile tubovillous adenomatous polyp,no high grade dysplasia or malignancy  .  Colon surgery  10/08/06    right laproscopic partial colectomy,Dr. Aviva Signs  . Colonoscopy w/ biopsies  2002,2004,,2008    numerous polypectomies and colonoscopies  . Fracture surgery      right arm fracture and repair    Current Outpatient Prescriptions  Medication Sig Dispense Refill  . aspirin 81 MG tablet Take 81 mg by mouth every evening.     . cholecalciferol (VITAMIN D) 1000 UNITS tablet Take 1,000 Units by mouth at bedtime.    . Multiple Vitamin (MULTIVITAMIN) capsule Take 1 capsule by mouth daily.    Marland Kitchen PHENObarbital (LUMINAL) 30 MG tablet Take 3 tablets (90 mg total) by mouth at bedtime. bedtime 270 tablet 1  . phenytoin (DILANTIN) 100 MG ER capsule TAKE 1 CAPSULE THREE TIMES DAILY 270 capsule 1  . simvastatin (ZOCOR) 20 MG tablet Take 1 tablet (20 mg total) by mouth daily. 90 tablet 1  . tamsulosin (FLOMAX) 0.4 MG CAPS capsule TAKE 1 CAPSULE EVERY DAY  AFTER  SUPPER 90 capsule 1   No current facility-administered medications for this visit.    Allergies as of 01/31/2015  . (No Known Allergies)    Family History  Problem Relation Age of Onset  . Cancer Father     prostate  . Cancer Maternal Uncle     unknown  . Cancer Paternal Aunt     unknown origin    Social History   Social History  . Marital Status: Married    Spouse Name: N/A  . Number of Children: 2  . Years of Education: N/A   Occupational History  .  [employer]  Social History Main Topics  . Smoking status: Former Smoker -- 1.00 packs/day for 25 years    Types: Cigarettes    Quit date: 05/13/1988  . Smokeless tobacco: Never Used  . Alcohol Use: No  . Drug Use: No     Comment: quit smoking 25 years agi  . Sexual Activity: Not on file   Other Topics Concern  . Not on file   Social History Narrative    Review of Systems: General: Negative for anorexia, weight loss, fever, chills, fatigue, weakness. Eyes: Negative for vision changes.  ENT: Negative for hoarseness, difficulty  swallowing. CV: Negative for chest pain, angina, palpitations, peripheral edema.  Respiratory: Negative for dyspnea at rest, cough, sputum, wheezing.  GI: See history of present illness. MS: Negative for joint pain, low back pain.  Derm: Negative for rash or itching.  Endo: Negative for unusual weight change.  Heme: Negative for bruising or bleeding. Allergy: Negative for rash or hives.    Physical Exam: BP 107/77 mmHg  Pulse 87  Temp(Src) 97.2 F (36.2 C) (Oral)  Ht 5\' 10"  (1.778 m)  Wt 218 lb (98.884 kg)  BMI 31.28 kg/m2 General:   Alert and oriented. Pleasant and cooperative. Well-nourished and well-developed.  Head:  Normocephalic and atraumatic. Eyes:  Without icterus, sclera clear and conjunctiva pink.  Ears:  Normal auditory acuity. Cardiovascular:  S1, S2 present without murmurs appreciated. Normal pulses noted. Extremities without clubbing or edema. Respiratory:  Clear to auscultation bilaterally. No wheezes, rales, or rhonchi. No distress.  Gastrointestinal:  +BS, rounded but soft, non-tender and non-distended. No HSM noted. No guarding or rebound. No masses appreciated.  Rectal:  Deferred  Skin:  Intact without significant lesions or rashes. Neurologic:  Alert and oriented x4;  grossly normal neurologically. Psych:  Alert and cooperative. Normal mood and affect.    01/31/2015 10:59 AM

## 2015-02-17 NOTE — Discharge Instructions (Signed)
°Colonoscopy °Discharge Instructions ° °Read the instructions outlined below and refer to this sheet in the next few weeks. These discharge instructions provide you with general information on caring for yourself after you leave the hospital. Your doctor may also give you specific instructions. While your treatment has been planned according to the most current medical practices available, unavoidable complications occasionally occur. If you have any problems or questions after discharge, call Dr. Rourk at [phone]. °ACTIVITY °· You may resume your regular activity, but move at a slower pace for the next 24 hours.  °· Take frequent rest periods for the next 24 hours.  °· Walking will help get rid of the air and reduce the bloated feeling in your belly (abdomen).  °· No driving for 24 hours (because of the medicine (anesthesia) used during the test).   °· Do not sign any important legal documents or operate any machinery for 24 hours (because of the anesthesia used during the test).  °NUTRITION °· Drink plenty of fluids.  °· You may resume your normal diet as instructed by your doctor.  °· Begin with a light meal and progress to your normal diet. Heavy or fried foods are harder to digest and may make you feel sick to your stomach (nauseated).  °· Avoid alcoholic beverages for 24 hours or as instructed.  °MEDICATIONS °· You may resume your normal medications unless your doctor tells you otherwise.  °WHAT YOU CAN EXPECT TODAY °· Some feelings of bloating in the abdomen.  °· Passage of more gas than usual.  °· Spotting of blood in your stool or on the toilet paper.  °IF YOU HAD POLYPS REMOVED DURING THE COLONOSCOPY: °· No aspirin products for 7 days or as instructed.  °· No alcohol for 7 days or as instructed.  °· Eat a soft diet for the next 24 hours.  °FINDING OUT THE RESULTS OF YOUR TEST °Not all test results are available during your visit. If your test results are not back during the visit, make an appointment  with your caregiver to find out the results. Do not assume everything is normal if you have not heard from your caregiver or the medical facility. It is important for you to follow up on all of your test results.  °SEEK IMMEDIATE MEDICAL ATTENTION IF: °· You have more than a spotting of blood in your stool.  °· Your belly is swollen (abdominal distention).  °· You are nauseated or vomiting.  °· You have a temperature over 101.  °· You have abdominal pain or discomfort that is severe or gets worse throughout the day.  ° ° ° °Diverticulosis information provided ° °Further recommendations to follow pending review of pathology report ° ° °Diverticulosis °Diverticulosis is the condition that develops when small pouches (diverticula) form in the wall of your colon. Your colon, or large intestine, is where water is absorbed and stool is formed. The pouches form when the inside layer of your colon pushes through weak spots in the outer layers of your colon. °CAUSES  °No one knows exactly what causes diverticulosis. °RISK FACTORS °Being older than 50. Your risk for this condition increases with age. Diverticulosis is rare in people younger than 40 years. By age 80, almost everyone has it. °Eating a low-fiber diet. °Being frequently constipated. °Being overweight. °Not getting enough exercise. °Smoking. °Taking over-the-counter pain medicines, like aspirin and ibuprofen. °SYMPTOMS  °Most people with diverticulosis do not have symptoms. °DIAGNOSIS  °Because diverticulosis often has no symptoms, health care   providers often discover the condition during an exam for other colon problems. In many cases, a health care provider will diagnose diverticulosis while using a flexible scope to examine the colon (colonoscopy). °TREATMENT  °If you have never developed an infection related to diverticulosis, you may not need treatment. If you have had an infection before, treatment may include: °Eating more fruits, vegetables, and  grains. °Taking a fiber supplement. °Taking a live bacteria supplement (probiotic). °Taking medicine to relax your colon. °HOME CARE INSTRUCTIONS  °Drink at least 6-8 glasses of water each day to prevent constipation. °Try not to strain when you have a bowel movement. °Keep all follow-up appointments. °If you have had an infection before:  °Increase the fiber in your diet as directed by your health care provider or dietitian. °Take a dietary fiber supplement if your health care provider approves. °Only take medicines as directed by your health care provider. °SEEK MEDICAL CARE IF:  °You have abdominal pain. °You have bloating. °You have cramps. °You have not gone to the bathroom in 3 days. °SEEK IMMEDIATE MEDICAL CARE IF:  °Your pain gets worse. °Your bloating becomes very bad. °You have a fever or chills, and your symptoms suddenly get worse. °You begin vomiting. °You have bowel movements that are bloody or black. °MAKE SURE YOU: °Understand these instructions. °Will watch your condition. °Will get help right away if you are not doing well or get worse. °  °This information is not intended to replace advice given to you by your health care provider. Make sure you discuss any questions you have with your health care provider. °  °Document Released: 01/25/2004 Document Revised: 05/04/2013 Document Reviewed: 03/24/2013 °Elsevier Interactive Patient Education ©2016 Elsevier Inc. ° °

## 2015-02-17 NOTE — Progress Notes (Addendum)
Patient Status post Right Heme colectomy No cecum.  Withdrawal from anastomosis  10 mins

## 2015-02-17 NOTE — Op Note (Signed)
[facility] [address] [place], [address]   COLONOSCOPY PROCEDURE REPORT  PATIENT: Brent Suarez, Brent Suarez  MR#: [id number] BIRTHDATE: 03-Jan-1946 , 75  yrs. old GENDER: male ENDOSCOPIST: R.  Garfield Cornea, MD FACP [facility] REFERRED OY:DXAJOIN Wolfgang Phoenix, M.D. PROCEDURE DATE:  2015/03/07 PROCEDURE:   Ileo-colonoscopy with biopsy INDICATIONS:Surveillance examination; history of advanced adenoma requiring right hemicolectomy previously. MEDICATIONS: Versed 3 mg IV and Demerol 75 mg IV in divided doses. Zofran 4 mg IV. ASA CLASS:       Class II  CONSENT: The risks, benefits, alternatives and imponderables including but not limited to bleeding, perforation as well as the possibility of a missed lesion have been reviewed.  The potential for biopsy, lesion removal, etc. have also been discussed. Questions have been answered.  All parties agreeable.  Please see the history and physical in the medical record for more information.  DESCRIPTION OF PROCEDURE:   After the risks benefits and alternatives of the procedure were thoroughly explained, informed consent was obtained.  The digital rectal exam revealed no abnormalities of the rectum.   The EC-[id number] ([id number])  endoscope was introduced through the anus and advanced to the surgical anastomosis. No adverse events experienced.   The quality of the prep was adequate  The instrument was then slowly withdrawn as the colon was fully examined. Estimated blood loss is zero unless otherwise noted in this procedure report.      COLON FINDINGS: Normal-appearing rectal mucosa.  Scattered left-sided diverticula; status post right hemicolectomy;  distal 10 cm of neoterminal ileum also appeared normal.  Mucosa anastomosis slightly "bumpy " in appearance.  This appeared to be more scar than any infiltrating process.  The remainder of the residual colonic mucosa appeared normal.  The anastomotic mucosa was biopsied.  Retroflexion was  performed. .   Withdrawal time=10 minutes 0 seconds.  The scope was withdrawn and the procedure completed. COMPLICATIONS: There were no immediate complications. EBL 3 mL ENDOSCOPIC IMPRESSION: Colonic diverticulosis. Status post right hemicolectomy. Abnormal mucosa at the anastomosis (probably not clinically significant)?"status post biopsy  RECOMMENDATIONS: Follow up on pathology.  eSigned:  R. Garfield Cornea, MD [facility] 03-07-2015 9:59 AM   cc:  CPT CODES: ICD CODES:  The ICD and CPT codes recommended by this software are interpretations from the data that the clinical staff has captured with the software.  The verification of the translation of this report to the ICD and CPT codes and modifiers is the sole responsibility of the health care institution and practicing physician where this report was generated.  [employer] will not be held responsible for the validity of the ICD and CPT codes included on this report.  AMA assumes no liability for data contained or not contained herein. CPT is a Designer, television/film set of the [employer].  PATIENT NAME:  Brent Suarez, Brent Suarez MR#: [id number]

## 2015-02-17 NOTE — Interval H&P Note (Signed)
History and Physical Interval Note:  02/17/2015 9:32 AM  Brent Suarez  has presented today for surgery, with the diagnosis of history of colon cancer  The various methods of treatment have been discussed with the patient and family. After consideration of risks, benefits and other options for treatment, the patient has consented to  Procedure(s) with comments: COLONOSCOPY (N/A) - 0930 as a surgical intervention .  The patient's history has been reviewed, patient examined, no change in status, stable for surgery.  I have reviewed the patient's chart and labs.  Questions were answered to the patient's satisfaction.     Brent Suarez   No change; surveillance colonoscopy per plan.The risks, benefits, limitations, alternatives and imponderables have been reviewed with the patient. Questions have been answered. All parties are agreeable.

## 2015-02-20 ENCOUNTER — Encounter: Payer: Self-pay | Admitting: Internal Medicine

## 2015-02-23 ENCOUNTER — Encounter (HOSPITAL_COMMUNITY): Payer: Self-pay | Admitting: Internal Medicine

## 2015-03-03 ENCOUNTER — Telehealth: Payer: Self-pay | Admitting: Family Medicine

## 2015-03-03 NOTE — Telephone Encounter (Signed)
Prescription at [facility].

## 2015-03-03 NOTE — Telephone Encounter (Signed)
Rx up front for pick up. Patient notified. 

## 2015-03-03 NOTE — Telephone Encounter (Signed)
wis 

## 2015-03-03 NOTE — Telephone Encounter (Signed)
Pt needs actual paper Rx for PHENObarbital (LUMINAL) 30 MG tablet needs a 90 day supply Pt will be filling at local pharmacy due to issues with the mail order  Please call pt when done  (Dr. Richardson Landry told pt to ask for this as wife check out)

## 2015-04-18 ENCOUNTER — Ambulatory Visit (INDEPENDENT_AMBULATORY_CARE_PROVIDER_SITE_OTHER): Payer: Medicare Other | Admitting: Urology

## 2015-04-18 DIAGNOSIS — C61 Malignant neoplasm of prostate: Secondary | ICD-10-CM

## 2015-04-18 DIAGNOSIS — N1330 Unspecified hydronephrosis: Secondary | ICD-10-CM | POA: Diagnosis not present

## 2015-05-10 DIAGNOSIS — Z23 Encounter for immunization: Secondary | ICD-10-CM | POA: Diagnosis not present

## 2015-08-01 ENCOUNTER — Other Ambulatory Visit: Payer: Self-pay | Admitting: *Deleted

## 2015-08-01 ENCOUNTER — Telehealth: Payer: Self-pay | Admitting: Family Medicine

## 2015-08-01 MED ORDER — PHENOBARBITAL 30 MG PO TABS: 90.0000 mg | ORAL_TABLET | Freq: Every day | ORAL | Status: DC

## 2015-08-01 MED ORDER — SIMVASTATIN 20 MG PO TABS: 20.0000 mg | ORAL_TABLET | Freq: Every day | ORAL | Status: DC

## 2015-08-01 NOTE — Telephone Encounter (Signed)
Patient is changing drug stores and would like a refill on phenobarbital 30 mg and simvastatin 20 mg sent to [facility].

## 2015-08-01 NOTE — Telephone Encounter (Signed)
Last seen 6.21.16.

## 2015-08-01 NOTE — Telephone Encounter (Signed)
90 d ok rec f u visit before finished

## 2015-08-01 NOTE — Telephone Encounter (Signed)
meds sent to pharm. Pt  Notified. Pt scheduled office visit.

## 2015-08-10 ENCOUNTER — Telehealth: Payer: Self-pay | Admitting: Family Medicine

## 2015-08-10 MED ORDER — PHENYTOIN SODIUM EXTENDED 100 MG PO CAPS: ORAL_CAPSULE | ORAL | Status: DC

## 2015-08-10 NOTE — Telephone Encounter (Signed)
phenytoin (DILANTIN) 100 MG ER capsule  Refill please  [facility]

## 2015-08-10 NOTE — Telephone Encounter (Signed)
Left message with gentleman to let patient know refill was sent to pharmacy

## 2015-09-26 ENCOUNTER — Ambulatory Visit: Payer: Medicare Other | Admitting: Family Medicine

## 2015-10-17 ENCOUNTER — Ambulatory Visit (INDEPENDENT_AMBULATORY_CARE_PROVIDER_SITE_OTHER): Payer: Medicare Other | Admitting: Family Medicine

## 2015-10-17 ENCOUNTER — Encounter: Payer: Self-pay | Admitting: Family Medicine

## 2015-10-17 VITALS — BP 122/84 | Ht 68.5 in | Wt 224.0 lb

## 2015-10-17 DIAGNOSIS — R7301 Impaired fasting glucose: Secondary | ICD-10-CM | POA: Diagnosis not present

## 2015-10-17 DIAGNOSIS — E785 Hyperlipidemia, unspecified: Secondary | ICD-10-CM | POA: Diagnosis not present

## 2015-10-17 DIAGNOSIS — C61 Malignant neoplasm of prostate: Secondary | ICD-10-CM

## 2015-10-17 DIAGNOSIS — G40909 Epilepsy, unspecified, not intractable, without status epilepticus: Secondary | ICD-10-CM

## 2015-10-17 DIAGNOSIS — Z79899 Other long term (current) drug therapy: Secondary | ICD-10-CM

## 2015-10-17 DIAGNOSIS — N400 Enlarged prostate without lower urinary tract symptoms: Secondary | ICD-10-CM | POA: Diagnosis not present

## 2015-10-17 MED ORDER — TAMSULOSIN HCL 0.4 MG PO CAPS: ORAL_CAPSULE | ORAL | Status: DC

## 2015-10-17 MED ORDER — SIMVASTATIN 20 MG PO TABS: 20.0000 mg | ORAL_TABLET | Freq: Every day | ORAL | Status: DC

## 2015-10-17 NOTE — Progress Notes (Signed)
   Subjective:    Patient ID: Brent Suarez, male    DOB: 08-15-1945, 70 y.o.   MRN: [id number]  Hyperlipidemia This is a chronic problem. The current episode started more than 1 year ago. Treatments tried: simvastatin. Compliance problems include adherence to exercise and adherence to diet.   patient compliant with lipido obvious side effects. Prior blood work reviewed.  Compliant with prostate medicine. Reports urine flow much improved with it on board. Recently ran out definitely wants to restart no obvious side effects  Compliant with seizure medicines. No recent seizures. Does not miss a dose. Blood work good earlier. Please see Pt concerned about his weight.   uro felt prost cancer was stable, followed by specialist for this   Review of Systems No headache, no major weight loss or weight gain, no chest pain no back pain abdominal pain no change in bowel habits complete ROS otherwise negative     Objective:   Physical Exam  Alert vital stable no focal neurological deficits. HEENT normal. Obesity present lungs clear heart rare rhythm ankles without edema      Assessment & Plan:  Impression 1 hyperlipidemia prior blood work results reviewed. Results uncertain discuss #2 osteohypertrophy ongoing need for meds meds have helped maintain #3 seizure disorder. Compliant with meds no obvious side effects no recent seizures plan all medications refilled. Diet and exercise discussed. Appropriate blood work further recommendations based results follow-up in 6 months [facility]

## 2015-10-18 ENCOUNTER — Ambulatory Visit: Payer: Medicare Other | Admitting: Family Medicine

## 2015-11-01 ENCOUNTER — Other Ambulatory Visit: Payer: Self-pay | Admitting: Family Medicine

## 2015-11-01 ENCOUNTER — Telehealth: Payer: Self-pay | Admitting: Family Medicine

## 2015-11-01 NOTE — Telephone Encounter (Signed)
Med sent to pharm. Pt notified on voicemail.  

## 2015-11-01 NOTE — Telephone Encounter (Signed)
Pt is requesting refills on his   phenytoin (DILANTIN) 100 MG ER capsule         [facility] [place]

## 2015-11-29 DIAGNOSIS — Z79899 Other long term (current) drug therapy: Secondary | ICD-10-CM | POA: Diagnosis not present

## 2015-11-29 DIAGNOSIS — E785 Hyperlipidemia, unspecified: Secondary | ICD-10-CM | POA: Diagnosis not present

## 2015-11-30 LAB — HEPATIC FUNCTION PANEL
ALK PHOS: 189 IU/L — AB (ref 39–117)
ALT: 20 IU/L (ref 0–44)
AST: 22 IU/L (ref 0–40)
Albumin: 4.5 g/dL (ref 3.6–4.8)
BILIRUBIN TOTAL: 0.3 mg/dL (ref 0.0–1.2)
BILIRUBIN, DIRECT: 0.09 mg/dL (ref 0.00–0.40)
TOTAL PROTEIN: 7.7 g/dL (ref 6.0–8.5)

## 2015-11-30 LAB — LIPID PANEL
Chol/HDL Ratio: 4.8 ratio units (ref 0.0–5.0)
Cholesterol, Total: 152 mg/dL (ref 100–199)
HDL: 32 mg/dL — AB (ref >39)
LDL Calculated: 65 mg/dL (ref 0–99)
TRIGLYCERIDES: 276 mg/dL — AB (ref 0–149)
VLDL Cholesterol Cal: 55 mg/dL — ABNORMAL HIGH (ref 5–40)

## 2015-12-03 ENCOUNTER — Encounter: Payer: Self-pay | Admitting: Family Medicine

## 2015-12-26 ENCOUNTER — Other Ambulatory Visit: Payer: Self-pay | Admitting: Family Medicine

## 2015-12-27 ENCOUNTER — Telehealth: Payer: Self-pay | Admitting: Family Medicine

## 2015-12-27 NOTE — Telephone Encounter (Signed)
ERROR

## 2015-12-27 NOTE — Telephone Encounter (Signed)
Six months worth of meds plz

## 2015-12-27 NOTE — Telephone Encounter (Signed)
Six mo ref

## 2015-12-28 ENCOUNTER — Other Ambulatory Visit: Payer: Self-pay | Admitting: Family Medicine

## 2015-12-28 ENCOUNTER — Telehealth: Payer: Self-pay | Admitting: Family Medicine

## 2015-12-28 ENCOUNTER — Other Ambulatory Visit: Payer: Self-pay | Admitting: *Deleted

## 2015-12-28 MED ORDER — PHENOBARBITAL 30 MG PO TABS: 90.0000 mg | ORAL_TABLET | Freq: Every day | ORAL | 5 refills | Status: DC

## 2015-12-28 NOTE — Telephone Encounter (Signed)
Pt came by and stated that it can be sent over to [facility] in [place] because they have it available there.

## 2015-12-28 NOTE — Telephone Encounter (Signed)
Pt is needing a hard scrip for his PHENObarbital (LUMINAL) 30 MG tablet [facility] dont have it and doesn't know when they will have it.

## 2015-12-28 NOTE — Telephone Encounter (Signed)
Pt notified on voicemail script faxed to pharm

## 2016-01-08 ENCOUNTER — Other Ambulatory Visit: Payer: Self-pay

## 2016-01-17 ENCOUNTER — Ambulatory Visit (INDEPENDENT_AMBULATORY_CARE_PROVIDER_SITE_OTHER): Payer: Medicare Other | Admitting: *Deleted

## 2016-01-17 DIAGNOSIS — Z23 Encounter for immunization: Secondary | ICD-10-CM

## 2016-04-08 ENCOUNTER — Emergency Department (HOSPITAL_COMMUNITY): Payer: Medicare Other

## 2016-04-08 ENCOUNTER — Encounter (HOSPITAL_COMMUNITY): Payer: Self-pay | Admitting: Emergency Medicine

## 2016-04-08 ENCOUNTER — Emergency Department (HOSPITAL_COMMUNITY)
Admission: EM | Admit: 2016-04-08 | Discharge: 2016-04-08 | Disposition: A | Discharge status: Home / Self Care | Payer: Medicare Other | Attending: Emergency Medicine | Admitting: Emergency Medicine

## 2016-04-08 DIAGNOSIS — I129 Hypertensive chronic kidney disease with stage 1 through stage 4 chronic kidney disease, or unspecified chronic kidney disease: Secondary | ICD-10-CM | POA: Diagnosis not present

## 2016-04-08 DIAGNOSIS — Z7982 Long term (current) use of aspirin: Secondary | ICD-10-CM | POA: Insufficient documentation

## 2016-04-08 DIAGNOSIS — K5900 Constipation, unspecified: Secondary | ICD-10-CM | POA: Diagnosis not present

## 2016-04-08 DIAGNOSIS — N189 Chronic kidney disease, unspecified: Secondary | ICD-10-CM | POA: Insufficient documentation

## 2016-04-08 DIAGNOSIS — Z79899 Other long term (current) drug therapy: Secondary | ICD-10-CM | POA: Diagnosis not present

## 2016-04-08 DIAGNOSIS — Z87891 Personal history of nicotine dependence: Secondary | ICD-10-CM | POA: Insufficient documentation

## 2016-04-08 NOTE — ED Notes (Signed)
Patient given discharge instruction, verbalized understand. Patient ambulatory out of the department.  

## 2016-04-08 NOTE — ED Triage Notes (Signed)
Pt states he has been constipated for approx 1 week. Pt has had small BMs and reports difficulty with passing stool.

## 2016-04-08 NOTE — Discharge Instructions (Signed)
Please go to the pharmacy and pick up the following medications  Magnesium Citrate - drink the whole bottle immediately (6 oz) Miralax - take one serving with each meal until you are having regular soft stools Colace - twice daily to keep your stool soft.  If you should develop increased pain, fever or vomiting please return to the emergency department immediately. Please follow-up with your family doctor within the next 3 days for recheck if you're still not having bowel movements

## 2016-04-08 NOTE — ED Provider Notes (Signed)
[facility] DEPT Provider Note   CSN: [id number] Arrival date & time: 04/08/16  1314     History   Chief Complaint Chief Complaint  Patient presents with  . Constipation    HPI BEARL BOISEN is a 70 y.o. male.  HPI  The patient is a 70 year old male, prior history of a partial colectomy in the past, he is unsure why he had this but reports that ever since that time he has episodes where he becomes constipated. He reports that his last good bowel movement was about one week ago, since that time he has had a fullness and discomfort in his left lower quadrant and feels like his rectum is full of stool yet he cannot pass any stool. He has not taken any laxatives but thought that eating a greasy sandwich would help to lubricate his bowels and have a bowel movement. He denies vomiting, nausea, abdominal distention and has had no fevers or difficulty urinating.  Past Medical History:  Diagnosis Date  . Chronic kidney disease    urolithiasis,b/l hydronephrosis  . Epilepsy ([place])    hx  . Glucose intolerance (impaired glucose tolerance)   . Hyperlipidemia    taking medication   . Hypertension   . Prostate cancer ([place]) 10/10/11   bx=Adenocarcinoma,gleason=3+3=6,PSA=6.03,PSA on 08/18/2011=5.35  . SBO (small bowel obstruction) 10/19/210  . Seizures ([place])    hx  last seizure 25 years ago  . UTI (lower urinary tract infection)    hx    Patient Active Problem List   Diagnosis Date Noted  . Diverticulosis of colon without hemorrhage   . Mucosal abnormality of colon   . History of colon cancer 01/31/2015  . Impaired fasting glucose 04/15/2013  . Elevated alkaline phosphatase level 04/15/2013  . Seizure disorder ([place]) 02/04/2013  . Prostate hypertrophy 02/04/2013  . Hyperlipidemia LDL goal <130 02/04/2013  . Prostate cancer ([place]) 10/10/2011    Past Surgical History:  Procedure Laterality Date  . APPENDECTOMY    . COLON SURGERY  10/08/06   right laproscopic partial  colectomy,Dr. Aviva Signs  . COLONOSCOPY N/A 02/17/2015   Procedure: COLONOSCOPY;  Surgeon: Daneil Dolin, MD;  Location: [facility];  Service: Endoscopy;  Laterality: N/A;  0930  . COLONOSCOPY W/ BIOPSIES  2002,2004,,2008   numerous polypectomies and colonoscopies  . COLONOSCOPY W/ BIOPSIES AND POLYPECTOMY  10/08/06   right terminal ileum,hemicolectomy,sessile tubovillous adenomatous polyp,no high grade dysplasia or malignancy  . FRACTURE SURGERY     right arm fracture and repair       Home Medications    Prior to Admission medications   Medication Sig Start Date End Date Taking? Authorizing Provider  aspirin 81 MG tablet Take 81 mg by mouth every evening.     Historical Provider, MD  cholecalciferol (VITAMIN D) 1000 UNITS tablet Take 1,000 Units by mouth at bedtime.    Historical Provider, MD  Multiple Vitamin (MULTIVITAMIN) capsule Take 1 capsule by mouth daily.    Historical Provider, MD  PHENObarbital (LUMINAL) 30 MG tablet Take 3 tablets (90 mg total) by mouth at bedtime. 12/28/15   Mikey Kirschner, MD  phenytoin (DILANTIN) 100 MG ER capsule Take 1 capsule (100 mg total) by mouth 3 (three) times daily. 11/01/15   Mikey Kirschner, MD  simvastatin (ZOCOR) 20 MG tablet Take 1 tablet (20 mg total) by mouth daily. 10/17/15   Mikey Kirschner, MD  tamsulosin (FLOMAX) 0.4 MG CAPS capsule TAKE 1 CAPSULE EVERY DAY  AFTER  SUPPER  10/17/15   Mikey Kirschner, MD    Family History Family History  Problem Relation Age of Onset  . Cancer Father     prostate  . Cancer Maternal Uncle     unknown  . Cancer Paternal Aunt     unknown origin  . Colon cancer Neg Hx     Social History Social History  Substance Use Topics  . Smoking status: Former Smoker    Packs/day: 1.00    Years: 25.00    Types: Cigarettes    Quit date: 05/13/1988  . Smokeless tobacco: Never Used  . Alcohol use No     Allergies   Patient has no known allergies.   Review of Systems Review of Systems  All other  systems reviewed and are negative.    Physical Exam Updated Vital Signs BP 128/89 (BP Location: Right Arm)   Pulse 83   Temp 98.6 F (37 C) (Oral)   Resp 15   Ht 5\' 10"  (1.778 m)   Wt 225 lb 1.6 oz (102.1 kg)   SpO2 93%   BMI 32.30 kg/m   Physical Exam  Constitutional: He appears well-developed and well-nourished. No distress.  HENT:  Head: Normocephalic and atraumatic.  Mouth/Throat: Oropharynx is clear and moist. No oropharyngeal exudate.  Eyes: Conjunctivae and EOM are normal. Pupils are equal, round, and reactive to light. Right eye exhibits no discharge. Left eye exhibits no discharge. No scleral icterus.  Neck: Normal range of motion. Neck supple. No JVD present. No thyromegaly present.  Cardiovascular: Normal rate, regular rhythm and intact distal pulses.  Exam reveals no gallop and no friction rub.   No murmur heard. Pulmonary/Chest: Effort normal and breath sounds normal. No respiratory distress. He has no wheezes. He has no rales.  No rales  Abdominal: Soft. Bowel sounds are normal. He exhibits no distension and no mass. There is tenderness ( Tender to palpation in the left lower quadrant and suprapubic area, no other tenderness, benign abdomen otherwise, no peritoneal signs).  Musculoskeletal: Normal range of motion. He exhibits no edema or tenderness.  No peripheral edema  Lymphadenopathy:    He has no cervical adenopathy.  Neurological: He is alert. Coordination normal.  Skin: Skin is warm and dry. No rash noted. No erythema.  Psychiatric: He has a normal mood and affect. His behavior is normal.  Nursing note and vitals reviewed.    ED Treatments / Results  Labs (all labs ordered are listed, but only abnormal results are displayed) Labs Reviewed - No data to display   Radiology Dg Abd 1 View  Result Date: 04/08/2016 CLINICAL DATA:  Constipation for approximately 1 week. History of prostate carcinoma EXAM: ABDOMEN - 1 VIEW COMPARISON:  July 17, 2012  FINDINGS: There is moderate stool throughout the colon. There is no bowel dilatation or air-fluid level suggesting bowel obstruction. No free air. There are vascular calcifications in the pelvis. There are clips in the prostate region. IMPRESSION: Moderate stool throughout colon. No bowel obstruction or free air evident. 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 Electronically Signed   By: Lowella Grip III M.D.   On: 04/08/2016 16:14    Procedures Procedures (including critical care time)  Medications Ordered in ED Medications - No data to display   Initial Impression / Assessment and Plan / ED Course  I have reviewed the triage vital signs and the nursing notes.  Pertinent labs & imaging results that were available during my care of the patient were reviewed by  me and considered in my medical decision making (see chart for details).  Clinical Course     Consider constipation likely, would also consider diverticulitis, x-ray ordered to evaluate for stool burden, if no significant stool burden could also be diverticulitis though I would doubt this is perforated, which he clinically at that point. The patient is well-appearing and can tolerate stool softeners   X-ray confirms a moderate amount of stool throughout the colon, no significant fecal impaction, the patient declines a rectal exam for a disimpaction, he is agreeable to taking oral medications, after discussion, he explained to me the indications for return very clearly, he appears stable for discharge  Recommended #1 magnesium citrate, #2 MiraLAX, #3 Colace  Final Clinical Impressions(s) / ED Diagnoses   Final diagnoses:  Constipation, unspecified constipation type    New Prescriptions New Prescriptions   No medications on file     Noemi Chapel, MD 04/08/16 1710

## 2016-04-16 ENCOUNTER — Ambulatory Visit (INDEPENDENT_AMBULATORY_CARE_PROVIDER_SITE_OTHER): Payer: Medicare Other | Admitting: Urology

## 2016-04-16 DIAGNOSIS — C61 Malignant neoplasm of prostate: Secondary | ICD-10-CM | POA: Diagnosis not present

## 2016-04-17 ENCOUNTER — Ambulatory Visit: Payer: Medicare Other | Admitting: Family Medicine

## 2016-04-22 ENCOUNTER — Ambulatory Visit (INDEPENDENT_AMBULATORY_CARE_PROVIDER_SITE_OTHER): Payer: Medicare Other | Admitting: Family Medicine

## 2016-04-22 ENCOUNTER — Encounter: Payer: Self-pay | Admitting: Family Medicine

## 2016-04-22 VITALS — BP 136/88 | Ht 68.0 in | Wt 223.4 lb

## 2016-04-22 DIAGNOSIS — Z79899 Other long term (current) drug therapy: Secondary | ICD-10-CM | POA: Diagnosis not present

## 2016-04-22 DIAGNOSIS — E785 Hyperlipidemia, unspecified: Secondary | ICD-10-CM | POA: Diagnosis not present

## 2016-04-22 DIAGNOSIS — G40909 Epilepsy, unspecified, not intractable, without status epilepticus: Secondary | ICD-10-CM

## 2016-04-22 DIAGNOSIS — N400 Enlarged prostate without lower urinary tract symptoms: Secondary | ICD-10-CM | POA: Diagnosis not present

## 2016-04-22 MED ORDER — TAMSULOSIN HCL 0.4 MG PO CAPS: ORAL_CAPSULE | ORAL | 1 refills | Status: DC

## 2016-04-22 MED ORDER — SIMVASTATIN 20 MG PO TABS: 20.0000 mg | ORAL_TABLET | Freq: Every day | ORAL | 1 refills | Status: DC

## 2016-04-22 MED ORDER — PHENOBARBITAL 30 MG PO TABS: 90.0000 mg | ORAL_TABLET | Freq: Every day | ORAL | 5 refills | Status: DC

## 2016-04-22 MED ORDER — PHENYTOIN SODIUM EXTENDED 100 MG PO CAPS: 100.0000 mg | ORAL_CAPSULE | Freq: Three times a day (TID) | ORAL | 1 refills | Status: DC

## 2016-04-22 NOTE — Progress Notes (Signed)
   Subjective:    Patient ID: ELIM PIEL, male    DOB: January 19, 1946, 70 y.o.   MRN: [id number]  Hyperlipidemia  This is a chronic problem. The current episode started more than 1 year ago. Treatments tried: zocor. There are no compliance problems.   Patient continues to take lipid medication regularly. No obvious side effects from it. Generally does not miss a dose. Prior blood work results are reviewed with patient. Patient continues to work on fat intake in diet   Patient had psa at urology, will contact   Flu shot already given    Results for orders placed or performed in visit on 10/17/15  Lipid panel  Result Value Ref Range   Cholesterol, Total 152 100 - 199 mg/dL   Triglycerides 276 (H) 0 - 149 mg/dL   HDL 32 (L) >39 mg/dL   VLDL Cholesterol Cal 55 (H) 5 - 40 mg/dL   LDL Calculated 65 0 - 99 mg/dL   Chol/HDL Ratio 4.8 0.0 - 5.0 ratio units  Hepatic function panel  Result Value Ref Range   Total Protein 7.7 6.0 - 8.5 g/dL   Albumin 4.5 3.6 - 4.8 g/dL   Bilirubin Total 0.3 0.0 - 1.2 mg/dL   Bilirubin, Direct 0.09 0.00 - 0.40 mg/dL   Alkaline Phosphatase 189 (H) 39 - 117 IU/L   AST 22 0 - 40 IU/L   ALT 20 0 - 44 IU/L   Patient states deftly needs his medication for urination. Use the Flomax. No obvious side effects with it. Definitely helps. Up about once per night now to urinate.   Walks some, but not a lot  Trying on the diet   sticke swith the seizure meds, No obvious side effects. Has not had a seizure for many years. Would like to stay on the medication. Review of Systems No headache, no major weight loss or weight gain, no chest pain no back pain abdominal pain no change in bowel habits complete ROS otherwise negative     Objective:   Physical Exam Alert vitals stable, NAD. Blood pressure good on repeat. HEENT normal. Lungs clear. Heart regular rate and rhythm.        Assessment & Plan:  Impression 1 hyperlipidemia status uncertain discuss hold results  discussed. #2 seizure disorder clinically stable on meds. #3 prostate hypertrophy. Medications handling well to continue discussed Plan check blood work. Diet exercise discussed. Medications refilled. Recheck in 6 months.

## 2016-04-24 ENCOUNTER — Other Ambulatory Visit: Payer: Self-pay | Admitting: *Deleted

## 2016-04-24 MED ORDER — PHENOBARBITAL 30 MG PO TABS: 90.0000 mg | ORAL_TABLET | Freq: Every day | ORAL | 5 refills | Status: DC

## 2016-06-26 ENCOUNTER — Telehealth: Payer: Self-pay | Admitting: *Deleted

## 2016-06-26 NOTE — Telephone Encounter (Signed)
Phenobarbital 30mg  tablet is denied for a tiering exception.  Med is on pt's drug list and will continue to be covered at the tier 2 copay. Copy of letter sent to [facility]. Letter scanned into pt's chart.

## 2016-09-27 ENCOUNTER — Ambulatory Visit (INDEPENDENT_AMBULATORY_CARE_PROVIDER_SITE_OTHER): Payer: Medicare Other | Admitting: Family Medicine

## 2016-09-27 ENCOUNTER — Encounter: Payer: Self-pay | Admitting: Family Medicine

## 2016-09-27 VITALS — BP 118/68 | Temp 97.5°F | Ht 68.0 in | Wt 225.0 lb

## 2016-09-27 DIAGNOSIS — R21 Rash and other nonspecific skin eruption: Secondary | ICD-10-CM

## 2016-09-27 NOTE — Progress Notes (Signed)
   Subjective:    Patient ID: Brent Suarez, male    DOB: 1946/03/02, 71 y.o.   MRN: [id number]  HPINeeds to have tick removed off of back of left thigh.   Patient arrives with pruritic patch in her thigh. Thinks he may slough part of a tick in there. Was embedded probably for 24 hours.  No headache no fever no rash elsewhere.  Somewhat pruritic no other difficulties.  Review of Systems No headache, no major weight loss or weight gain, no chest pain no back pain abdominal pain no change in bowel habits complete ROS otherwise negative     Objective:   Physical Exam Alert vitals stable, NAD. Blood pressure good on repeat. HEENT normal. Lungs clear. Heart regular rate and rhythm. Focal erythematous patch slight edema slight central erosion no obvious tick part present       Assessment & Plan:  Impression tick bite with localized irritation/excoriation rash no more no last discussed warning signs discussed at length

## 2016-10-11 ENCOUNTER — Other Ambulatory Visit: Payer: Self-pay | Admitting: Family Medicine

## 2016-11-17 ENCOUNTER — Other Ambulatory Visit: Payer: Self-pay | Admitting: Family Medicine

## 2016-11-18 ENCOUNTER — Other Ambulatory Visit: Payer: Self-pay | Admitting: Family Medicine

## 2016-11-18 NOTE — Telephone Encounter (Signed)
Last seen 04/22/16 for medication check

## 2016-12-06 ENCOUNTER — Other Ambulatory Visit: Payer: Self-pay | Admitting: Family Medicine

## 2016-12-06 NOTE — Telephone Encounter (Signed)
Last seen for med check 04/22/16

## 2016-12-06 NOTE — Telephone Encounter (Signed)
May have one refill needs office visit

## 2017-01-16 ENCOUNTER — Other Ambulatory Visit: Payer: Self-pay | Admitting: Family Medicine

## 2017-01-16 NOTE — Telephone Encounter (Signed)
Patient last seen 04/22/16 for chronic med check. Please advise?

## 2017-01-20 ENCOUNTER — Telehealth: Payer: Self-pay | Admitting: Family Medicine

## 2017-01-20 MED ORDER — TAMSULOSIN HCL 0.4 MG PO CAPS: ORAL_CAPSULE | ORAL | 0 refills | Status: DC

## 2017-01-20 MED ORDER — PHENYTOIN SODIUM EXTENDED 100 MG PO CAPS: 100.0000 mg | ORAL_CAPSULE | Freq: Three times a day (TID) | ORAL | 0 refills | Status: DC

## 2017-01-20 MED ORDER — SIMVASTATIN 20 MG PO TABS: 20.0000 mg | ORAL_TABLET | Freq: Every day | ORAL | 0 refills | Status: DC

## 2017-01-20 NOTE — Telephone Encounter (Signed)
Pt is needing refills on  PHENObarbital (LUMINAL) 30 MG tablet  phenytoin (DILANTIN) 100 MG ER capsule simvastatin (ZOCOR) 20 MG tablet tamsulosin (FLOMAX) 0.4 MG CAPS capsule   [facility]

## 2017-01-20 NOTE — Telephone Encounter (Signed)
Late on six mo ck up, plz sched, may refill meds

## 2017-01-20 NOTE — Telephone Encounter (Signed)
Prescriptions sent electronically to pharmacy. Patient notified. °

## 2017-01-20 NOTE — Telephone Encounter (Signed)
May we refill phenobarbital?

## 2017-01-20 NOTE — Telephone Encounter (Signed)
This is Dr. Steeves patient thank you 

## 2017-01-21 ENCOUNTER — Other Ambulatory Visit: Payer: Self-pay | Admitting: Family Medicine

## 2017-01-21 ENCOUNTER — Ambulatory Visit (INDEPENDENT_AMBULATORY_CARE_PROVIDER_SITE_OTHER): Payer: Medicare Other

## 2017-01-21 DIAGNOSIS — Z23 Encounter for immunization: Secondary | ICD-10-CM | POA: Diagnosis not present

## 2017-02-19 ENCOUNTER — Other Ambulatory Visit: Payer: Self-pay | Admitting: Family Medicine

## 2017-02-19 NOTE — Telephone Encounter (Signed)
Last seen 04/28/16

## 2017-02-19 NOTE — Telephone Encounter (Signed)
Call pt needs six mo o v when agrees call in one mo worth all

## 2017-02-26 DIAGNOSIS — Z79899 Other long term (current) drug therapy: Secondary | ICD-10-CM | POA: Diagnosis not present

## 2017-02-26 DIAGNOSIS — E785 Hyperlipidemia, unspecified: Secondary | ICD-10-CM | POA: Diagnosis not present

## 2017-02-27 LAB — BASIC METABOLIC PANEL
BUN/Creatinine Ratio: 14 (ref 10–24)
BUN: 10 mg/dL (ref 8–27)
CALCIUM: 9.2 mg/dL (ref 8.6–10.2)
CHLORIDE: 102 mmol/L (ref 96–106)
CO2: 23 mmol/L (ref 20–29)
Creatinine, Ser: 0.74 mg/dL — ABNORMAL LOW (ref 0.76–1.27)
GFR, EST AFRICAN AMERICAN: 108 mL/min/{1.73_m2} (ref >59)
GFR, EST NON AFRICAN AMERICAN: 93 mL/min/{1.73_m2} (ref >59)
Glucose: 101 mg/dL — ABNORMAL HIGH (ref 65–99)
POTASSIUM: 4.3 mmol/L (ref 3.5–5.2)
SODIUM: 140 mmol/L (ref 134–144)

## 2017-02-27 LAB — HEPATIC FUNCTION PANEL
ALK PHOS: 173 IU/L — AB (ref 39–117)
ALT: 14 IU/L (ref 0–44)
AST: 20 IU/L (ref 0–40)
Albumin: 4.5 g/dL (ref 3.5–4.8)
Bilirubin Total: 0.3 mg/dL (ref 0.0–1.2)
Bilirubin, Direct: 0.1 mg/dL (ref 0.00–0.40)
Total Protein: 7.3 g/dL (ref 6.0–8.5)

## 2017-02-27 LAB — LIPID PANEL
CHOLESTEROL TOTAL: 165 mg/dL (ref 100–199)
Chol/HDL Ratio: 5 ratio (ref 0.0–5.0)
HDL: 33 mg/dL — ABNORMAL LOW (ref >39)
LDL CALC: 91 mg/dL (ref 0–99)
TRIGLYCERIDES: 203 mg/dL — AB (ref 0–149)
VLDL Cholesterol Cal: 41 mg/dL — ABNORMAL HIGH (ref 5–40)

## 2017-03-10 ENCOUNTER — Encounter: Payer: Self-pay | Admitting: Family Medicine

## 2017-03-10 ENCOUNTER — Ambulatory Visit (INDEPENDENT_AMBULATORY_CARE_PROVIDER_SITE_OTHER): Payer: Medicare Other | Admitting: Family Medicine

## 2017-03-10 VITALS — BP 122/86 | Ht 68.0 in | Wt 221.0 lb

## 2017-03-10 DIAGNOSIS — Z23 Encounter for immunization: Secondary | ICD-10-CM | POA: Diagnosis not present

## 2017-03-10 DIAGNOSIS — E785 Hyperlipidemia, unspecified: Secondary | ICD-10-CM | POA: Diagnosis not present

## 2017-03-10 DIAGNOSIS — G40909 Epilepsy, unspecified, not intractable, without status epilepticus: Secondary | ICD-10-CM

## 2017-03-10 DIAGNOSIS — N400 Enlarged prostate without lower urinary tract symptoms: Secondary | ICD-10-CM | POA: Diagnosis not present

## 2017-03-10 MED ORDER — PHENYTOIN SODIUM EXTENDED 100 MG PO CAPS: 100.0000 mg | ORAL_CAPSULE | Freq: Three times a day (TID) | ORAL | 1 refills | Status: DC

## 2017-03-10 MED ORDER — TAMSULOSIN HCL 0.4 MG PO CAPS: ORAL_CAPSULE | ORAL | 1 refills | Status: DC

## 2017-03-10 MED ORDER — PHENOBARBITAL 30 MG PO TABS: 90.0000 mg | ORAL_TABLET | Freq: Every day | ORAL | 1 refills | Status: DC

## 2017-03-10 MED ORDER — VITAMIN D 1000 UNITS PO TABS: 1000.0000 [IU] | ORAL_TABLET | Freq: Every day | ORAL | 1 refills | Status: AC

## 2017-03-10 MED ORDER — SIMVASTATIN 20 MG PO TABS: 20.0000 mg | ORAL_TABLET | Freq: Every day | ORAL | 1 refills | Status: DC

## 2017-03-10 NOTE — Progress Notes (Signed)
   Subjective:    Patient ID: Brent Suarez, male    DOB: 28-Nov-1945, 71 y.o.   MRN: [id number]  HPI  Pt arrives today to discuss lab results.,  Along with his numerous chronic medical concerns No other concerns.  Patient continues to take lipid medication regularly. No obvious side effects from it. Generally does not miss a dose. Prior blood work results are reviewed with patient. Patient continues to work on fat intake in diet  Exercising reg, tries to get out and stay active  Patient maintains compliance with seizure medications.  No obvious side effects.  Generally does not miss a dose.  Patient is maintaining his Flomax.  May be slight lightheadedness with it.  States it definitely helps his urination.  Patient claims working on diet work on both sugars and cholesterol in diet   Results for orders placed or performed in visit on 04/22/16  Lipid panel  Result Value Ref Range   Cholesterol, Total 165 100 - 199 mg/dL   Triglycerides 203 (H) 0 - 149 mg/dL   HDL 33 (L) >39 mg/dL   VLDL Cholesterol Cal 41 (H) 5 - 40 mg/dL   LDL Calculated 91 0 - 99 mg/dL   Chol/HDL Ratio 5.0 0.0 - 5.0 ratio  Hepatic function panel  Result Value Ref Range   Total Protein 7.3 6.0 - 8.5 g/dL   Albumin 4.5 3.5 - 4.8 g/dL   Bilirubin Total 0.3 0.0 - 1.2 mg/dL   Bilirubin, Direct 0.10 0.00 - 0.40 mg/dL   Alkaline Phosphatase 173 (H) 39 - 117 IU/L   AST 20 0 - 40 IU/L   ALT 14 0 - 44 IU/L  Basic metabolic panel  Result Value Ref Range   Glucose 101 (H) 65 - 99 mg/dL   BUN 10 8 - 27 mg/dL   Creatinine, Ser 0.74 (L) 0.76 - 1.27 mg/dL   GFR calc non Af Amer 93 >59 mL/min/1.73   GFR calc Af Amer 108 >59 mL/min/1.73   BUN/Creatinine Ratio 14 10 - 24   Sodium 140 134 - 144 mmol/L   Potassium 4.3 3.5 - 5.2 mmol/L   Chloride 102 96 - 106 mmol/L   CO2 23 20 - 29 mmol/L   Calcium 9.2 8.6 - 10.2 mg/dL    Review of Systems No headache, no major weight loss or weight gain, no chest pain no back pain  abdominal pain no change in bowel habits complete ROS otherwise negative     Objective:   Physical Exam  Alert and oriented, vitals reviewed and stable, NAD ENT-TM's and ext canals WNL bilat via otoscopic exam Soft palate, tonsils and post pharynx WNL via oropharyngeal exam Neck-symmetric, no masses; thyroid nonpalpable and nontender Pulmonary-no tachypnea or accessory muscle use; Clear without wheezes via auscultation Card--no abnrml murmurs, rhythm reg and rate WNL Carotid pulses symmetric, without bruits       Assessment & Plan:  Impression 1 seizure disorder clinically stable compliance discussed to maintain same meds/blood work reviewed  2.  Prostate hypertrophy.  Ongoing need for medications meds discussed patient can tolerate possible side effect  3.  Hyperlipidemia.  Chronic with medicines.  Has not missed a dose.  Results reviewed  Greater than 50% of this 25 minute face to face visit was spent in counseling and discussion and coordination of care regarding the above diagnosis/diagnosies

## 2017-04-05 ENCOUNTER — Inpatient Hospital Stay (HOSPITAL_COMMUNITY)
Admission: EM | Admit: 2017-04-05 | Discharge: 2017-04-07 | DRG: 390 | Disposition: A | Discharge status: Home Health | Payer: Medicare Other | Attending: Family Medicine | Admitting: Family Medicine

## 2017-04-05 ENCOUNTER — Emergency Department (HOSPITAL_COMMUNITY): Payer: Medicare Other

## 2017-04-05 ENCOUNTER — Other Ambulatory Visit: Payer: Self-pay

## 2017-04-05 ENCOUNTER — Encounter (HOSPITAL_COMMUNITY): Payer: Self-pay | Admitting: Emergency Medicine

## 2017-04-05 DIAGNOSIS — K56609 Unspecified intestinal obstruction, unspecified as to partial versus complete obstruction: Secondary | ICD-10-CM | POA: Diagnosis present

## 2017-04-05 DIAGNOSIS — E785 Hyperlipidemia, unspecified: Secondary | ICD-10-CM | POA: Diagnosis present

## 2017-04-05 DIAGNOSIS — R748 Abnormal levels of other serum enzymes: Secondary | ICD-10-CM | POA: Diagnosis not present

## 2017-04-05 DIAGNOSIS — R7302 Impaired glucose tolerance (oral): Secondary | ICD-10-CM | POA: Diagnosis present

## 2017-04-05 DIAGNOSIS — Z86010 Personal history of colonic polyps: Secondary | ICD-10-CM

## 2017-04-05 DIAGNOSIS — Z8546 Personal history of malignant neoplasm of prostate: Secondary | ICD-10-CM

## 2017-04-05 DIAGNOSIS — J9811 Atelectasis: Secondary | ICD-10-CM | POA: Diagnosis not present

## 2017-04-05 DIAGNOSIS — G40909 Epilepsy, unspecified, not intractable, without status epilepticus: Secondary | ICD-10-CM | POA: Diagnosis not present

## 2017-04-05 DIAGNOSIS — K56600 Partial intestinal obstruction, unspecified as to cause: Principal | ICD-10-CM | POA: Diagnosis present

## 2017-04-05 DIAGNOSIS — Z87442 Personal history of urinary calculi: Secondary | ICD-10-CM | POA: Diagnosis not present

## 2017-04-05 DIAGNOSIS — I129 Hypertensive chronic kidney disease with stage 1 through stage 4 chronic kidney disease, or unspecified chronic kidney disease: Secondary | ICD-10-CM | POA: Diagnosis present

## 2017-04-05 DIAGNOSIS — N400 Enlarged prostate without lower urinary tract symptoms: Secondary | ICD-10-CM | POA: Diagnosis present

## 2017-04-05 DIAGNOSIS — Z87891 Personal history of nicotine dependence: Secondary | ICD-10-CM

## 2017-04-05 DIAGNOSIS — N189 Chronic kidney disease, unspecified: Secondary | ICD-10-CM | POA: Diagnosis present

## 2017-04-05 DIAGNOSIS — K5900 Constipation, unspecified: Secondary | ICD-10-CM | POA: Diagnosis present

## 2017-04-05 DIAGNOSIS — I10 Essential (primary) hypertension: Secondary | ICD-10-CM | POA: Diagnosis present

## 2017-04-05 DIAGNOSIS — R109 Unspecified abdominal pain: Secondary | ICD-10-CM | POA: Diagnosis not present

## 2017-04-05 DIAGNOSIS — I700 Atherosclerosis of aorta: Secondary | ICD-10-CM | POA: Diagnosis present

## 2017-04-05 DIAGNOSIS — Z7982 Long term (current) use of aspirin: Secondary | ICD-10-CM

## 2017-04-05 LAB — CBC WITH DIFFERENTIAL/PLATELET
BASOS ABS: 0 10*3/uL (ref 0.0–0.1)
BASOS PCT: 0 %
Eosinophils Absolute: 0.2 10*3/uL (ref 0.0–0.7)
Eosinophils Relative: 2 %
HEMATOCRIT: 44.1 % (ref 39.0–52.0)
HEMOGLOBIN: 14.4 g/dL (ref 13.0–17.0)
LYMPHS PCT: 24 %
Lymphs Abs: 1.9 10*3/uL (ref 0.7–4.0)
MCH: 29.3 pg (ref 26.0–34.0)
MCHC: 32.7 g/dL (ref 30.0–36.0)
MCV: 89.8 fL (ref 78.0–100.0)
MONO ABS: 0.4 10*3/uL (ref 0.1–1.0)
MONOS PCT: 5 %
NEUTROS ABS: 5.5 10*3/uL (ref 1.7–7.7)
NEUTROS PCT: 69 %
Platelets: 167 10*3/uL (ref 150–400)
RBC: 4.91 MIL/uL (ref 4.22–5.81)
RDW: 13.3 % (ref 11.5–15.5)
WBC: 8 10*3/uL (ref 4.0–10.5)

## 2017-04-05 LAB — COMPREHENSIVE METABOLIC PANEL
ALBUMIN: 4.7 g/dL (ref 3.5–5.0)
ALT: 17 U/L (ref 17–63)
ANION GAP: 9 (ref 5–15)
AST: 21 U/L (ref 15–41)
Alkaline Phosphatase: 175 U/L — ABNORMAL HIGH (ref 38–126)
BUN: 16 mg/dL (ref 6–20)
CHLORIDE: 102 mmol/L (ref 101–111)
CO2: 28 mmol/L (ref 22–32)
Calcium: 9.2 mg/dL (ref 8.9–10.3)
Creatinine, Ser: 0.81 mg/dL (ref 0.61–1.24)
GFR calc Af Amer: >60 mL/min (ref >60)
GFR calc non Af Amer: >60 mL/min (ref >60)
GLUCOSE: 101 mg/dL — AB (ref 65–99)
POTASSIUM: 3.5 mmol/L (ref 3.5–5.1)
Sodium: 139 mmol/L (ref 135–145)
TOTAL PROTEIN: 8.3 g/dL — AB (ref 6.5–8.1)
Total Bilirubin: 0.3 mg/dL (ref 0.3–1.2)

## 2017-04-05 LAB — URINALYSIS, ROUTINE W REFLEX MICROSCOPIC
BILIRUBIN URINE: NEGATIVE
Glucose, UA: NEGATIVE mg/dL
HGB URINE DIPSTICK: NEGATIVE
Ketones, ur: NEGATIVE mg/dL
Leukocytes, UA: NEGATIVE
NITRITE: NEGATIVE
PROTEIN: NEGATIVE mg/dL
Specific Gravity, Urine: 1.038 — ABNORMAL HIGH (ref 1.005–1.030)
pH: 6 (ref 5.0–8.0)

## 2017-04-05 LAB — LACTIC ACID, PLASMA
LACTIC ACID, VENOUS: 1.5 mmol/L (ref 0.5–1.9)
Lactic Acid, Venous: 1.1 mmol/L (ref 0.5–1.9)

## 2017-04-05 LAB — LIPASE, BLOOD: LIPASE: 34 U/L (ref 11–51)

## 2017-04-05 LAB — TROPONIN I

## 2017-04-05 MED ORDER — POTASSIUM CHLORIDE IN NACL 20-0.9 MEQ/L-% IV SOLN: INTRAVENOUS | Status: DC

## 2017-04-05 MED ORDER — IOPAMIDOL (ISOVUE-300) INJECTION 61%
100.0000 mL | Freq: Once | INTRAVENOUS | Status: AC | PRN
  Administered 2017-04-05: 100 mL via INTRAVENOUS

## 2017-04-05 MED ORDER — MORPHINE SULFATE (PF) 2 MG/ML IV SOLN
2.0000 mg | INTRAVENOUS | Status: AC | PRN
  Administered 2017-04-06: 2 mg via INTRAVENOUS
  Filled 2017-04-05: qty 1

## 2017-04-05 MED ORDER — SODIUM CHLORIDE 0.9 % IV SOLN
INTRAVENOUS | Status: DC
  Administered 2017-04-05: 22:00:00 via INTRAVENOUS

## 2017-04-05 MED ORDER — MORPHINE SULFATE (PF) 4 MG/ML IV SOLN
4.0000 mg | INTRAVENOUS | Status: DC | PRN
  Administered 2017-04-05: 4 mg via INTRAVENOUS
  Filled 2017-04-05: qty 1

## 2017-04-05 MED ORDER — HEPARIN SODIUM (PORCINE) 5000 UNIT/ML IJ SOLN
5000.0000 [IU] | Freq: Three times a day (TID) | INTRAMUSCULAR | Status: DC
  Administered 2017-04-06 – 2017-04-07 (×5): 5000 [IU] via SUBCUTANEOUS
  Filled 2017-04-05 (×5): qty 1

## 2017-04-05 MED ORDER — POTASSIUM CHLORIDE IN NACL 20-0.9 MEQ/L-% IV SOLN
INTRAVENOUS | Status: DC
  Administered 2017-04-05 – 2017-04-06 (×3): via INTRAVENOUS

## 2017-04-05 MED ORDER — ONDANSETRON HCL 4 MG/2ML IJ SOLN
4.0000 mg | INTRAMUSCULAR | Status: DC | PRN
  Administered 2017-04-05: 4 mg via INTRAVENOUS
  Filled 2017-04-05: qty 2

## 2017-04-05 MED ORDER — ONDANSETRON HCL 4 MG/2ML IJ SOLN: 4.0000 mg | Freq: Four times a day (QID) | INTRAMUSCULAR | Status: DC | PRN

## 2017-04-05 MED ORDER — PHENOBARBITAL 60 MG PO TABS: 90.0000 mg | ORAL_TABLET | Freq: Every day | ORAL | Status: DC

## 2017-04-05 MED ORDER — PHENYTOIN SODIUM EXTENDED 100 MG PO CAPS
300.0000 mg | ORAL_CAPSULE | Freq: Every day | ORAL | Status: DC
  Administered 2017-04-06 (×2): 300 mg via ORAL
  Filled 2017-04-05 (×2): qty 3

## 2017-04-05 NOTE — ED Provider Notes (Signed)
[facility] EMERGENCY DEPARTMENT Provider Note   CSN: [id number] Arrival date & time: 04/05/17  1858     History   Chief Complaint Chief Complaint  Patient presents with  . Abdominal Pain    HPI Brent Suarez is a 71 y.o. male.  HPI  Pt was seen at [facility].  Per pt, c/o gradual onset and worsening of persistent generalized abd "pain" for the past several days, worse today. Describes the abd pain as "when I had a bowel blockage." States he is "trying" to pass flatus.  Denies N/V, no diarrhea, no fevers, no back pain, no rash, no CP/SOB, no black or blood in stools.      Past Medical History:  Diagnosis Date  . Chronic kidney disease    urolithiasis,b/l hydronephrosis  . Epilepsy ([place])    hx  . Glucose intolerance (impaired glucose tolerance)   . Hyperlipidemia    taking medication   . Hypertension   . Prostate cancer ([place]) 10/10/11   bx=Adenocarcinoma,gleason=3+3=6,PSA=6.03,PSA on 08/18/2011=5.35  . SBO (small bowel obstruction) ([place]) 10/19/210  . Seizures ([place])    hx  last seizure 25 years ago  . UTI (lower urinary tract infection)    hx    Patient Active Problem List   Diagnosis Date Noted  . Diverticulosis of colon without hemorrhage   . Mucosal abnormality of colon   . History of colon cancer 01/31/2015  . Impaired fasting glucose 04/15/2013  . Elevated alkaline phosphatase level 04/15/2013  . Seizure disorder ([place]) 02/04/2013  . Prostate hypertrophy 02/04/2013  . Hyperlipidemia LDL goal <130 02/04/2013  . Prostate cancer ([place]) 10/10/2011    Past Surgical History:  Procedure Laterality Date  . APPENDECTOMY    . COLON SURGERY  10/08/06   right laproscopic partial colectomy,Dr. Aviva Signs  . COLONOSCOPY N/A 02/17/2015   Procedure: COLONOSCOPY;  Surgeon: Daneil Dolin, MD;  Location: [facility];  Service: Endoscopy;  Laterality: N/A;  0930  . COLONOSCOPY W/ BIOPSIES  2002,2004,,2008   numerous polypectomies and colonoscopies  . COLONOSCOPY W/ BIOPSIES  AND POLYPECTOMY  10/08/06   right terminal ileum,hemicolectomy,sessile tubovillous adenomatous polyp,no high grade dysplasia or malignancy  . FRACTURE SURGERY     right arm fracture and repair       Home Medications    Prior to Admission medications   Medication Sig Start Date End Date Taking? Authorizing Provider  aspirin 81 MG tablet Take 81 mg by mouth every evening.     [provider]  cholecalciferol (VITAMIN D) 1000 units tablet Take 1 tablet (1,000 Units total) by mouth at bedtime. 03/10/17   Mikey Kirschner, MD  Multiple Vitamin (MULTIVITAMIN) capsule Take 1 capsule by mouth daily.    [provider]  PHENObarbital (LUMINAL) 30 MG tablet Take 3 tablets (90 mg total) by mouth at bedtime. 03/10/17   Mikey Kirschner, MD  phenytoin (DILANTIN) 100 MG ER capsule Take 1 capsule (100 mg total) by mouth 3 (three) times daily. 03/10/17   Mikey Kirschner, MD  simvastatin (ZOCOR) 20 MG tablet Take 1 tablet (20 mg total) by mouth daily. 03/10/17   Mikey Kirschner, MD  tamsulosin [facility]) 0.4 MG CAPS capsule take 1 capsule once daily with food 03/10/17   Mikey Kirschner, MD    Family History Family History  Problem Relation Age of Onset  . Cancer Father        prostate  . Cancer Maternal Uncle  unknown  . Cancer Paternal Aunt        unknown origin  . Colon cancer Neg Hx     Social History Social History   Tobacco Use  . Smoking status: Former Smoker    Packs/day: 1.00    Years: 25.00    Pack years: 25.00    Types: Cigarettes    Last attempt to quit: 05/13/1988    Years since quitting: 28.9  . Smokeless tobacco: Never Used  Substance Use Topics  . Alcohol use: No    Alcohol/week: 0.0 oz  . Drug use: No     Allergies   Patient has no known allergies.   Review of Systems Review of Systems ROS: Statement: All systems negative except as marked or noted in the HPI; Constitutional: Negative for fever and chills. ; ; Eyes: Negative for eye  pain, redness and discharge. ; ; ENMT: Negative for ear pain, hoarseness, nasal congestion, sinus pressure and sore throat. ; ; Cardiovascular: Negative for chest pain, palpitations, diaphoresis, dyspnea and peripheral edema. ; ; Respiratory: Negative for cough, wheezing and stridor. ; ; Gastrointestinal: +abd pain. Negative for nausea, vomiting, diarrhea, blood in stool, hematemesis, jaundice and rectal bleeding. . ; ; Genitourinary: Negative for dysuria, flank pain and hematuria. ; ; Musculoskeletal: Negative for back pain and neck pain. Negative for swelling and trauma.; ; Skin: Negative for pruritus, rash, abrasions, blisters, bruising and skin lesion.; ; Neuro: Negative for headache, lightheadedness and neck stiffness. Negative for weakness, altered level of consciousness, altered mental status, extremity weakness, paresthesias, involuntary movement, seizure and syncope.       Physical Exam Updated Vital Signs BP (!) 148/94 (BP Location: Right Arm)   Pulse 68   Temp 97.7 F (36.5 C) (Oral)   Resp 18   Ht 5\' 10"  (1.778 m)   Wt 95.3 kg (210 lb)   SpO2 96%   BMI 30.13 kg/m   Physical Exam 1925: Physical examination:  Nursing notes reviewed; Vital signs and O2 SAT reviewed;  Constitutional: Well developed, Well nourished, Well hydrated, In no acute distress; Head:  Normocephalic, atraumatic; Eyes: EOMI, PERRL, No scleral icterus; ENMT: Mouth and pharynx normal, Mucous membranes moist; Neck: Supple, Full range of motion, No lymphadenopathy; Cardiovascular: Regular rate and rhythm, No gallop; Respiratory: Breath sounds clear & equal bilaterally, No wheezes.  Speaking full sentences with ease, Normal respiratory effort/excursion; Chest: Nontender, Movement normal; Abdomen: Soft, +diffuse tenderness, +distended, +tympanitic. Decreased bowel sounds; Genitourinary: No CVA tenderness; Extremities: Pulses normal, No tenderness, No edema, No calf edema or asymmetry.; Neuro: AA&Ox3, Major CN grossly  intact.  Speech clear. No gross focal motor or sensory deficits in extremities. Climbs on and off stretcher easily by himself. Gait steady.; Skin: Color normal, Warm, Dry.   ED Treatments / Results  Labs (all labs ordered are listed, but only abnormal results are displayed)   EKG  EKG Interpretation  Date/Time:  Saturday April 05 2017 19:35:59 EST Ventricular Rate:  71 PR Interval:    QRS Duration: 126 QT Interval:  414 QTC Calculation: 450 R Axis:   87 Text Interpretation:  Sinus rhythm Right bundle branch block Inferior infarct, old Lateral leads are also involved When compared with ECG of 07/17/2012 No significant change was found Confirmed by Francine Graven [phone]) on 04/05/2017 7:42:44 PM       Radiology   Procedures Procedures (including critical care time)  Medications Ordered in ED Medications  morphine 4 MG/ML injection 4 mg (not administered)  ondansetron (ZOFRAN) injection  4 mg (not administered)     Initial Impression / Assessment and Plan / ED Course  I have reviewed the triage vital signs and the nursing notes.  Pertinent labs & imaging results that were available during my care of the patient were reviewed by me and considered in my medical decision making (see chart for details).  MDM Reviewed: previous chart, nursing note and vitals Reviewed previous: labs and ECG Interpretation: labs, ECG, x-ray and CT scan    Results for orders placed or performed during the hospital encounter of 04/05/17  Comprehensive metabolic panel  Result Value Ref Range   Sodium 139 135 - 145 mmol/L   Potassium 3.5 3.5 - 5.1 mmol/L   Chloride 102 101 - 111 mmol/L   CO2 28 22 - 32 mmol/L   Glucose, Bld 101 (H) 65 - 99 mg/dL   BUN 16 6 - 20 mg/dL   Creatinine, Ser 0.81 0.61 - 1.24 mg/dL   Calcium 9.2 8.9 - 10.3 mg/dL   Total Protein 8.3 (H) 6.5 - 8.1 g/dL   Albumin 4.7 3.5 - 5.0 g/dL   AST 21 15 - 41 U/L   ALT 17 17 - 63 U/L   Alkaline Phosphatase 175 (H) 38 -  126 U/L   Total Bilirubin 0.3 0.3 - 1.2 mg/dL   GFR calc non Af Amer >60 >60 mL/min   GFR calc Af Amer >60 >60 mL/min   Anion gap 9 5 - 15  Lipase, blood  Result Value Ref Range   Lipase 34 11 - 51 U/L  CBC with Differential  Result Value Ref Range   WBC 8.0 4.0 - 10.5 K/uL   RBC 4.91 4.22 - 5.81 MIL/uL   Hemoglobin 14.4 13.0 - 17.0 g/dL   HCT 44.1 39.0 - 52.0 %   MCV 89.8 78.0 - 100.0 fL   MCH 29.3 26.0 - 34.0 pg   MCHC 32.7 30.0 - 36.0 g/dL   RDW 13.3 11.5 - 15.5 %   Platelets 167 150 - 400 K/uL   Neutrophils Relative % 69 %   Neutro Abs 5.5 1.7 - 7.7 K/uL   Lymphocytes Relative 24 %   Lymphs Abs 1.9 0.7 - 4.0 K/uL   Monocytes Relative 5 %   Monocytes Absolute 0.4 0.1 - 1.0 K/uL   Eosinophils Relative 2 %   Eosinophils Absolute 0.2 0.0 - 0.7 K/uL   Basophils Relative 0 %   Basophils Absolute 0.0 0.0 - 0.1 K/uL  Lactic acid, plasma  Result Value Ref Range   Lactic Acid, Venous 1.1 0.5 - 1.9 mmol/L  Troponin I  Result Value Ref Range   Troponin I <0.03 <0.03 ng/mL   Dg Chest 2 View Result Date: 04/05/2017 CLINICAL DATA:  70 year old male with severe lower abdominal pain. EXAM: CHEST  2 VIEW COMPARISON:  07/17/2012 FINDINGS: Cardiomediastinal silhouette is borderline enlarged but stable in size and configuration. There are low lung volumes with bronchovascular crowding and bibasilar atelectasis. No focal airspace opacities, pleural effusion or pneumothorax. No acute osseous abnormality.  Evaluation of the abdomen is limited. IMPRESSION: Low lung volumes with bronchovascular crowding and bibasilar atelectasis. Otherwise, no acute intrathoracic pathology. Electronically Signed   By: Kristopher Oppenheim M.D.   On: 04/05/2017 20:27   Ct Abdomen Pelvis W Contrast Result Date: 04/05/2017 CLINICAL DATA:  Generalized abdominal pain and distention for 2 days. Previous history of small bowel obstruction, urinary tract infection, seizures, prostate cancer, hypertension, hydronephrosis,  epilepsy. EXAM: CT ABDOMEN AND PELVIS WITH  CONTRAST TECHNIQUE: Multidetector CT imaging of the abdomen and pelvis was performed using the standard protocol following bolus administration of intravenous contrast. CONTRAST:  133mL ISOVUE-300 IOPAMIDOL (ISOVUE-300) INJECTION 61% COMPARISON:  10/04/2010 FINDINGS: Lower chest: Atelectasis in the lung bases. Hepatobiliary: No focal liver abnormality is seen. No gallstones, gallbladder wall thickening, or biliary dilatation. Pancreas: Unremarkable. No pancreatic ductal dilatation or surrounding inflammatory changes. Spleen: Normal in size without focal abnormality. Adrenals/Urinary Tract: No adrenal gland nodules. Kidneys are symmetrical. Nephrograms are homogeneous. Prominent extrarenal pelvis on the right, likely represent normal variation. No ureteral stone or hydroureter. Bladder is unremarkable. Stomach/Bowel: Stomach is unremarkable. Mildly dilated small bowel with a few air-fluid levels. Distal bowel is decompressed. Transition zone is in the anterior mid abdomen at the level of the umbilicus. There is small bowel feces sign above the transition zone. Colon is decompressed. There appears to be a previous partial right hemicolectomy with ileal colonic anastomosis. Vascular/Lymphatic: Aortic atherosclerosis. No enlarged abdominal or pelvic lymph nodes. Reproductive: Prostate gland is enlarged. Metallic foreign bodies consistent with prostate seed implants or surgical clips. Other: No free air or free fluid in the abdomen. Prominent visceral adipose tissues. Small periumbilical hernia containing fat. Musculoskeletal: Degenerative changes in the spine. No destructive bone lesions. IMPRESSION: 1. Low-grade or partial small bowel obstruction with transition zone in the anterior mid abdomen. 2. Aortic atherosclerosis. Electronically Signed   By: Lucienne Capers M.D.   On: 04/05/2017 21:20     2135:  SBO on CT scan. No N/V while in the ED, only c/o abd pain. Dx and  testing d/w pt and family.  Questions answered.  Verb understanding, agreeable to admit.   T/C to [facility] Dr. Olevia Bowens, case discussed, including:  HPI, pertinent PM/SHx, VS/PE, dx testing, ED course and treatment:  Agreeable to admit.  T/C to General Surgery Dr. Arnoldo Morale, case discussed, including:  HPI, pertinent PM/SHx, VS/PE, dx testing, ED course and treatment:  Agreeable to consult tomorrow.      Final Clinical Impressions(s) / ED Diagnoses   Final diagnoses:  None    ED Discharge Orders    None       Francine Graven, DO 04/09/17 Bosie Helper

## 2017-04-05 NOTE — ED Triage Notes (Signed)
All over abd pain x2 days, more severe today. States no "good" BM in a few days.  Pt has hx of blockage, states this feels the same.

## 2017-04-05 NOTE — ED Notes (Signed)
Gave EKG to Dr.Mcmanus 

## 2017-04-05 NOTE — H&P (Signed)
History and Physical    TYCEN DOCKTER VVZ:[id number] DOB: [birth year] DOA: 04/05/2017  PCP: Mikey Kirschner, MD   Patient coming from: Home.  I have personally briefly reviewed patient's old medical records in [place]  Chief Complaint: Abdominal pain for 2 days.  HPI: Brent Suarez is a 71 y.o. male with medical history significant of chronic kidney disease, urolithiasis with history of bilateral hydronephrosis, glucose intolerance, hyperlipidemia, hypertension, prostate cancer history, seizure disorder, history of SBO x2 in the past who is coming to the emergency department with complaints of abdominal pain for the past 2-day associated with constipation despite his use of Miralax at home.  He denies fever, chills, nausea, emesis, diarrhea, melena or hematochezia.  He denies dysuria, frequency or hematuria.  No chest pain, dyspnea, dizziness, diaphoresis, palpitations, PND, orthopnea or pitting edema of the lower extremities.  He denies blurry vision, polyuria, polydipsia or polyphagia.   ED Course: Initial vital signs temperature 36.5C, pulse 68, respirations 18, blood pressure 140/94 mmHg and O2 sat 96%.  The patient was started on IV fluids, given morphine and Zofran in the emergency department.  His CBC was normal with WBCs of 8.0 with a normal differential, hemoglobin 14.4 g/dL and platelets 167.  Lactic acid, troponin and lipase were normal.  His CMP shows a glucose of 101 mg/dL, total protein 8.3 g/dL and alkaline phosphatase 175 u/L.  All other chemistry values are normal.  Magnesium is pending at this time.  Imaging: CT abdomen/pelvis shows a low-grade or partial small bowel obstruction with transition zone in the anterior mid abdomen.  There was aortic atherosclerosis.  Please see images and full radiology report for further detail.  Review of Systems: As per HPI otherwise 10 point review of systems negative.    Past Medical History:  Diagnosis Date  . Chronic  kidney disease    urolithiasis,b/l hydronephrosis  . Epilepsy ([place])    hx  . Glucose intolerance (impaired glucose tolerance)   . Hyperlipidemia    taking medication   . Hypertension   . Prostate cancer ([place]) 10/10/11   bx=Adenocarcinoma,gleason=3+3=6,PSA=6.03,PSA on 08/18/2011=5.35  . SBO (small bowel obstruction) ([place]) 10/19/210  . Seizures ([place])    hx  last seizure 25 years ago  . UTI (lower urinary tract infection)    hx    Past Surgical History:  Procedure Laterality Date  . APPENDECTOMY    . COLON SURGERY  10/08/06   right laproscopic partial colectomy,Dr. Aviva Signs  . COLONOSCOPY N/A 02/17/2015   Procedure: COLONOSCOPY;  Surgeon: Daneil Dolin, MD;  Location: [facility];  Service: Endoscopy;  Laterality: N/A;  0930  . COLONOSCOPY W/ BIOPSIES  2002,2004,,2008   numerous polypectomies and colonoscopies  . COLONOSCOPY W/ BIOPSIES AND POLYPECTOMY  10/08/06   right terminal ileum,hemicolectomy,sessile tubovillous adenomatous polyp,no high grade dysplasia or malignancy  . FRACTURE SURGERY     right arm fracture and repair     reports that he quit smoking about 28 years ago. His smoking use included cigarettes. He has a 25.00 pack-year smoking history. he has never used smokeless tobacco. He reports that he does not drink alcohol or use drugs.  No Known Allergies  Family History  Problem Relation Age of Onset  . Cancer Father        prostate  . Cancer Maternal Uncle        unknown  . Cancer Paternal Aunt        unknown origin  . Colon cancer  Neg Hx      Prior to Admission medications   Medication Sig Start Date End Date Taking? Authorizing Provider  aspirin 81 MG tablet Take 81 mg by mouth every evening.    Yes [provider]  Omega-3 Fatty Acids (FISH OIL) 1000 MG CAPS Take 1 capsule by mouth at bedtime.   Yes [provider]  PHENObarbital (LUMINAL) 30 MG tablet Take 3 tablets (90 mg total) by mouth at bedtime. 03/10/17  Yes Mikey Kirschner,  MD  phenytoin (DILANTIN) 100 MG ER capsule Take 1 capsule (100 mg total) by mouth 3 (three) times daily. Patient taking differently: Take 300 mg by mouth at bedtime.  03/10/17  Yes Mikey Kirschner, MD  polyethylene glycol powder (GLYCOLAX/MIRALAX) powder Take 17 g by mouth once as needed for mild constipation or moderate constipation.   Yes [provider]  simvastatin (ZOCOR) 20 MG tablet Take 1 tablet (20 mg total) by mouth daily. 03/10/17  Yes Mikey Kirschner, MD  tamsulosin (FLOMAX) 0.4 MG CAPS capsule take 1 capsule once daily with food Patient taking differently: Take 0.4 mg by mouth every evening. take 1 capsule once daily with food 03/10/17  Yes Mikey Kirschner, MD  vitamin C (ASCORBIC ACID) 500 MG tablet Take 500 mg by mouth at bedtime.   Yes [provider]  cholecalciferol (VITAMIN D) 1000 units tablet Take 1 tablet (1,000 Units total) by mouth at bedtime. Patient not taking: Reported on 04/05/2017 03/10/17   Mikey Kirschner, MD    Physical Exam: Vitals:   04/05/17 1911 04/05/17 1912  BP: (!) 148/94   Pulse: 68   Resp: 18   Temp: 97.7 F (36.5 C)   TempSrc: Oral   SpO2: 96%   Weight:  95.3 kg (210 lb)  Height:  5' 10"  (1.778 m)    Constitutional: NAD, calm, comfortable  Eyes: PERRL, lids and conjunctivae normal ENMT: Mucous membranes are moist. Posterior pharynx clear of any exudate or lesions. Neck: normal, supple, no masses, no thyromegaly Respiratory: clear to auscultation bilaterally, no wheezing, no crackles. Normal respiratory effort. No accessory muscle use.  Cardiovascular: Regular rate and rhythm, no murmurs / rubs / gallops. No extremity edema. 2+ pedal pulses. No carotid bruits.  Abdomen: Distended, soft,  tenderness, no masses palpated. No hepatosplenomegaly. Bowel sounds positive.  Musculoskeletal: no clubbing / cyanosis. No joint deformity upper and lower extremities. Good ROM, no contractures. Normal muscle tone.  Skin: no rashes,  lesions, ulcers. No induration Neurologic: CN 2-12 grossly intact. Sensation intact, DTR normal. Strength 5/5 in all 4.  Psychiatric: Normal judgment and insight. Alert and oriented x 3. Normal mood.    Labs on Admission: I have personally reviewed following labs and imaging studies  CBC: Recent Labs  Lab 04/05/17 1941  WBC 8.0  NEUTROABS 5.5  HGB 14.4  HCT 44.1  MCV 89.8  PLT 245   Basic Metabolic Panel: Recent Labs  Lab 04/05/17 1941  NA 139  K 3.5  CL 102  CO2 28  GLUCOSE 101*  BUN 16  CREATININE 0.81  CALCIUM 9.2   GFR: Estimated Creatinine Clearance: 98.3 mL/min (by C-G formula based on SCr of 0.81 mg/dL). Liver Function Tests: Recent Labs  Lab 04/05/17 1941  AST 21  ALT 17  ALKPHOS 175*  BILITOT 0.3  PROT 8.3*  ALBUMIN 4.7   Recent Labs  Lab 04/05/17 1941  LIPASE 34   No results for input(s): AMMONIA in the last 168 hours. Coagulation  Profile: No results for input(s): INR, PROTIME in the last 168 hours. Cardiac Enzymes: Recent Labs  Lab 04/05/17 1941  TROPONINI <0.03   BNP (last 3 results) No results for input(s): PROBNP in the last 8760 hours. HbA1C: No results for input(s): HGBA1C in the last 72 hours. CBG: No results for input(s): GLUCAP in the last 168 hours. Lipid Profile: No results for input(s): CHOL, HDL, LDLCALC, TRIG, CHOLHDL, LDLDIRECT in the last 72 hours. Thyroid Function Tests: No results for input(s): TSH, T4TOTAL, FREET4, T3FREE, THYROIDAB in the last 72 hours. Anemia Panel: No results for input(s): VITAMINB12, FOLATE, FERRITIN, TIBC, IRON, RETICCTPCT in the last 72 hours. Urine analysis:    Component Value Date/Time   COLORURINE YELLOW 02/13/2009 2348   APPEARANCEUR HAZY (A) 02/13/2009 2348   LABSPEC 1.015 02/13/2009 2348   PHURINE 7.5 02/13/2009 2348   GLUCOSEU NEGATIVE 02/13/2009 2348   HGBUR NEGATIVE 02/13/2009 2348   BILIRUBINUR NEGATIVE 02/13/2009 2348   KETONESUR NEGATIVE 02/13/2009 2348   PROTEINUR NEGATIVE  02/13/2009 2348   UROBILINOGEN 0.2 02/13/2009 2348   NITRITE NEGATIVE 02/13/2009 2348   LEUKOCYTESUR  02/13/2009 2348    NEGATIVE MICROSCOPIC NOT DONE ON URINES WITH NEGATIVE PROTEIN, BLOOD, LEUKOCYTES, NITRITE, OR GLUCOSE <1000 mg/dL.    Radiological Exams on Admission: Dg Chest 2 View  Result Date: 04/05/2017 CLINICAL DATA:  71 year old male with severe lower abdominal pain. EXAM: CHEST  2 VIEW COMPARISON:  07/17/2012 FINDINGS: Cardiomediastinal silhouette is borderline enlarged but stable in size and configuration. There are low lung volumes with bronchovascular crowding and bibasilar atelectasis. No focal airspace opacities, pleural effusion or pneumothorax. No acute osseous abnormality.  Evaluation of the abdomen is limited. IMPRESSION: Low lung volumes with bronchovascular crowding and bibasilar atelectasis. Otherwise, no acute intrathoracic pathology. Electronically Signed   By: Kristopher Oppenheim M.D.   On: 04/05/2017 20:27   Ct Abdomen Pelvis W Contrast  Result Date: 04/05/2017 CLINICAL DATA:  Generalized abdominal pain and distention for 2 days. Previous history of small bowel obstruction, urinary tract infection, seizures, prostate cancer, hypertension, hydronephrosis, epilepsy. EXAM: CT ABDOMEN AND PELVIS WITH CONTRAST TECHNIQUE: Multidetector CT imaging of the abdomen and pelvis was performed using the standard protocol following bolus administration of intravenous contrast. CONTRAST:  169m ISOVUE-300 IOPAMIDOL (ISOVUE-300) INJECTION 61% COMPARISON:  10/04/2010 FINDINGS: Lower chest: Atelectasis in the lung bases. Hepatobiliary: No focal liver abnormality is seen. No gallstones, gallbladder wall thickening, or biliary dilatation. Pancreas: Unremarkable. No pancreatic ductal dilatation or surrounding inflammatory changes. Spleen: Normal in size without focal abnormality. Adrenals/Urinary Tract: No adrenal gland nodules. Kidneys are symmetrical. Nephrograms are homogeneous. Prominent  extrarenal pelvis on the right, likely represent normal variation. No ureteral stone or hydroureter. Bladder is unremarkable. Stomach/Bowel: Stomach is unremarkable. Mildly dilated small bowel with a few air-fluid levels. Distal bowel is decompressed. Transition zone is in the anterior mid abdomen at the level of the umbilicus. There is small bowel feces sign above the transition zone. Colon is decompressed. There appears to be a previous partial right hemicolectomy with ileal colonic anastomosis. Vascular/Lymphatic: Aortic atherosclerosis. No enlarged abdominal or pelvic lymph nodes. Reproductive: Prostate gland is enlarged. Metallic foreign bodies consistent with prostate seed implants or surgical clips. Other: No free air or free fluid in the abdomen. Prominent visceral adipose tissues. Small periumbilical hernia containing fat. Musculoskeletal: Degenerative changes in the spine. No destructive bone lesions. IMPRESSION: 1. Low-grade or partial small bowel obstruction with transition zone in the anterior mid abdomen. 2. Aortic atherosclerosis. Electronically Signed   By:  Lucienne Capers M.D.   On: 04/05/2017 21:20    EKG: Independently reviewed. Vent. rate 71 BPM PR interval * ms QRS duration 126 ms QT/QTc 414/450 ms P-R-T axes 48 87 12 Sinus rhythm Right bundle branch block Inferior infarct, old Lateral leads are also involved  Assessment/Plan Principal Problem:   SBO (small bowel obstruction) (HCC) Admit to inpatient/MedSurg. Keep n.p.o. Continue IV fluids with potassium supplementation. Magnesium level is still pending.  However, will supplement with Mag sulfate IVPB. Trial of tapwater enema, the patient thinks that if he has a bowel movement, SBO may resolve. Consult general surgery in a.m.  Active Problems:   Constipation Trial of tap water enema.    Glucose intolerance   (Impaired glucose tolerance) Currently n.p.o. Check CBG every 6 hours while fasting. Check hemoglobin  A1c. Switch CBG to [facility] and at bedtime once clear for oral intake.    Seizure disorder (HCC) Continue phenobarbital 90 mg p.o. at bedtime. Continue phenytoin 300 mg p.o. at bedtime. Will check phenobarbital and phenytoin level with next blood draw in the morning.    Prostate hypertrophy Hold tamsulosin 0.4 mg now. Alpha blockers may worsen constipation some patients. Resume Flomax once SBO resolves.    Hyperlipidemia LDL goal <130 Resume simvastatin once tolerating diet.    Hypertension Currently not taking medications. BP elevated earlier in the ER, normal now. Monitor blood pressure. Consider medical therapy if BP persistently elevated.    Elevated alkaline phosphatase This is chronic. Monitor alk phos level.    DVT prophylaxis: Lovenox SQ. Code Status: Full code. Family Communication:  Disposition Plan: Admit for symptoms management, IV hydration, NGT suctioning as needed and general surgery evaluation. Consults called: Routine general surgery consult in the morning. Admission status: Inpatient/MedSurg.   Reubin Milan MD [facility] Pager [phone]  If 7PM-7AM, please contact night-coverage [email or web address] Password [facility]  04/05/2017, 10:13 PM

## 2017-04-06 ENCOUNTER — Encounter (HOSPITAL_COMMUNITY): Payer: Self-pay | Admitting: Internal Medicine

## 2017-04-06 DIAGNOSIS — R7302 Impaired glucose tolerance (oral): Secondary | ICD-10-CM | POA: Diagnosis present

## 2017-04-06 DIAGNOSIS — K56609 Unspecified intestinal obstruction, unspecified as to partial versus complete obstruction: Secondary | ICD-10-CM

## 2017-04-06 LAB — COMPREHENSIVE METABOLIC PANEL
ALT: 18 U/L (ref 17–63)
AST: 25 U/L (ref 15–41)
Albumin: 3.9 g/dL (ref 3.5–5.0)
Alkaline Phosphatase: 153 U/L — ABNORMAL HIGH (ref 38–126)
Anion gap: 8 (ref 5–15)
BUN: 14 mg/dL (ref 6–20)
CHLORIDE: 104 mmol/L (ref 101–111)
CO2: 25 mmol/L (ref 22–32)
Calcium: 8.6 mg/dL — ABNORMAL LOW (ref 8.9–10.3)
Creatinine, Ser: 0.59 mg/dL — ABNORMAL LOW (ref 0.61–1.24)
GFR calc non Af Amer: >60 mL/min (ref >60)
GLUCOSE: 100 mg/dL — AB (ref 65–99)
POTASSIUM: 4.3 mmol/L (ref 3.5–5.1)
SODIUM: 137 mmol/L (ref 135–145)
TOTAL PROTEIN: 7.4 g/dL (ref 6.5–8.1)
Total Bilirubin: 0.6 mg/dL (ref 0.3–1.2)

## 2017-04-06 LAB — CBC WITH DIFFERENTIAL/PLATELET
Basophils Absolute: 0 10*3/uL (ref 0.0–0.1)
Basophils Relative: 0 %
Eosinophils Absolute: 0.1 10*3/uL (ref 0.0–0.7)
Eosinophils Relative: 1 %
HEMATOCRIT: 42.3 % (ref 39.0–52.0)
Hemoglobin: 14 g/dL (ref 13.0–17.0)
LYMPHS PCT: 11 %
Lymphs Abs: 1 10*3/uL (ref 0.7–4.0)
MCH: 29.8 pg (ref 26.0–34.0)
MCHC: 33.1 g/dL (ref 30.0–36.0)
MCV: 90 fL (ref 78.0–100.0)
MONO ABS: 0.5 10*3/uL (ref 0.1–1.0)
MONOS PCT: 6 %
NEUTROS ABS: 7.4 10*3/uL (ref 1.7–7.7)
Neutrophils Relative %: 82 %
Platelets: 162 10*3/uL (ref 150–400)
RBC: 4.7 MIL/uL (ref 4.22–5.81)
RDW: 13.2 % (ref 11.5–15.5)
WBC: 9 10*3/uL (ref 4.0–10.5)

## 2017-04-06 LAB — MAGNESIUM: Magnesium: 1.9 mg/dL (ref 1.7–2.4)

## 2017-04-06 LAB — MRSA PCR SCREENING: MRSA by PCR: NEGATIVE

## 2017-04-06 MED ORDER — MAGNESIUM SULFATE 2 GM/50ML IV SOLN
2.0000 g | Freq: Once | INTRAVENOUS | Status: AC
  Administered 2017-04-06: 2 g via INTRAVENOUS
  Filled 2017-04-06: qty 50

## 2017-04-06 MED ORDER — PHENOBARBITAL 97.2 MG PO TABS
97.2000 mg | ORAL_TABLET | Freq: Every day | ORAL | Status: DC
  Filled 2017-04-06: qty 1

## 2017-04-06 NOTE — Progress Notes (Signed)
Denies nausea and abd pain but states feels like has a lot of gas.  Passing gas and had another small bm Ate all of chicken soup

## 2017-04-06 NOTE — Progress Notes (Addendum)
Tap water enema taken to room to be given. Before administration PT had medium sized solid, brown bowel movement. PT states no need for enema at this time. Continue to monitor PT.

## 2017-04-06 NOTE — Consult Note (Signed)
Reason for Consult: Small bowel obstruction Referring Physician: Dr. Glenna Durand is an 71 y.o. male.  HPI: Patient is a 71 year old white male with multiple medical problems including chronic kidney disease, hypertension, seizure disorder, and history of bowel obstructions in the past who presents with a 2-day history of worsening abdominal distention and pain.  He last had a bowel movement several days ago.  He denies any nausea, vomiting, diarrhea, melena, or fevers.  CT scan of the abdomen was performed which revealed partial small bowel obstruction, most likely secondary to adhesive disease.  He was admitted to the hospital without an NG tube for further evaluation and treatment.  Since his admission, he has had a bowel movement.  His abdominal pain has resolved.  He has had no nausea or vomiting.  Past Medical History:  Diagnosis Date  . Epilepsy ([place])    hx  . Glucose intolerance (impaired glucose tolerance)   . Hyperlipidemia    taking medication   . Hypertension   . Prostate cancer ([place]) 10/10/11   bx=Adenocarcinoma,gleason=3+3=6,PSA=6.03,PSA on 08/18/2011=5.35  . SBO (small bowel obstruction) ([place]) 10/19/210  . Seizures ([place])    hx  last seizure 25 years ago  . UTI (lower urinary tract infection)    hx    Past Surgical History:  Procedure Laterality Date  . APPENDECTOMY    . COLON SURGERY  10/08/06   right laproscopic partial colectomy,Dr. Aviva Signs  . COLONOSCOPY N/A 02/17/2015   Procedure: COLONOSCOPY;  Surgeon: Daneil Dolin, MD;  Location[facility];  Service: Endoscopy;  Laterality: N/A;  0930  . COLONOSCOPY W/ BIOPSIES  2002,2004,,2008   numerous polypectomies and colonoscopies  . COLONOSCOPY W/ BIOPSIES AND POLYPECTOMY  10/08/06   right terminal ileum,hemicolectomy,sessile tubovillous adenomatous polyp,no high grade dysplasia or malignancy  . FRACTURE SURGERY     right arm fracture and repair    Family History  Problem Relation Age of Onset  .  Cancer Father        prostate  . Cancer Maternal Uncle        unknown  . Cancer Paternal Aunt        unknown origin  . Colon cancer Neg Hx     Social History:  reports that he quit smoking about 28 years ago. His smoking use included cigarettes. He has a 25.00 pack-year smoking history. he has never used smokeless tobacco. He reports that he does not drink alcohol or use drugs.  Allergies: No Known Allergies  Medications: I have reviewed the patient's current medications.  Results for orders placed or performed during the hospital encounter of 04/05/17 (from the past 48 hour(s))  Comprehensive metabolic panel     Status: Abnormal   Collection Time: 04/05/17  7:41 PM  Result Value Ref Range   Sodium 139 135 - 145 mmol/L   Potassium 3.5 3.5 - 5.1 mmol/L   Chloride 102 101 - 111 mmol/L   CO2 28 22 - 32 mmol/L   Glucose, Bld 101 (H) 65 - 99 mg/dL   BUN 16 6 - 20 mg/dL   Creatinine, Ser 0.81 0.61 - 1.24 mg/dL   Calcium 9.2 8.9 - 10.3 mg/dL   Total Protein 8.3 (H) 6.5 - 8.1 g/dL   Albumin 4.7 3.5 - 5.0 g/dL   AST 21 15 - 41 U/L   ALT 17 17 - 63 U/L   Alkaline Phosphatase 175 (H) 38 - 126 U/L   Total Bilirubin 0.3 0.3 - 1.2 mg/dL  GFR calc non Af Amer >60 >60 mL/min   GFR calc Af Amer >60 >60 mL/min    Comment: (NOTE) The eGFR has been calculated using the CKD EPI equation. This calculation has not been validated in all clinical situations. eGFR's persistently <60 mL/min signify possible Chronic Kidney Disease.    Anion gap 9 5 - 15  Lipase, blood     Status: None   Collection Time: 04/05/17  7:41 PM  Result Value Ref Range   Lipase 34 11 - 51 U/L  CBC with Differential     Status: None   Collection Time: 04/05/17  7:41 PM  Result Value Ref Range   WBC 8.0 4.0 - 10.5 K/uL   RBC 4.91 4.22 - 5.81 MIL/uL   Hemoglobin 14.4 13.0 - 17.0 g/dL   HCT 44.1 39.0 - 52.0 %   MCV 89.8 78.0 - 100.0 fL   MCH 29.3 26.0 - 34.0 pg   MCHC 32.7 30.0 - 36.0 g/dL   RDW 13.3 11.5 - 15.5 %    Platelets 167 150 - 400 K/uL   Neutrophils Relative % 69 %   Neutro Abs 5.5 1.7 - 7.7 K/uL   Lymphocytes Relative 24 %   Lymphs Abs 1.9 0.7 - 4.0 K/uL   Monocytes Relative 5 %   Monocytes Absolute 0.4 0.1 - 1.0 K/uL   Eosinophils Relative 2 %   Eosinophils Absolute 0.2 0.0 - 0.7 K/uL   Basophils Relative 0 %   Basophils Absolute 0.0 0.0 - 0.1 K/uL  Lactic acid, plasma     Status: None   Collection Time: 04/05/17  7:41 PM  Result Value Ref Range   Lactic Acid, Venous 1.1 0.5 - 1.9 mmol/L  Troponin I     Status: None   Collection Time: 04/05/17  7:41 PM  Result Value Ref Range   Troponin I <0.03 <0.03 ng/mL  Magnesium     Status: None   Collection Time: 04/05/17  7:54 PM  Result Value Ref Range   Magnesium 1.9 1.7 - 2.4 mg/dL  Lactic acid, plasma     Status: None   Collection Time: 04/05/17  9:09 PM  Result Value Ref Range   Lactic Acid, Venous 1.5 0.5 - 1.9 mmol/L  Urinalysis, Routine w reflex microscopic     Status: Abnormal   Collection Time: 04/05/17 10:45 PM  Result Value Ref Range   Color, Urine YELLOW YELLOW   APPearance CLEAR CLEAR   Specific Gravity, Urine 1.038 (H) 1.005 - 1.030   pH 6.0 5.0 - 8.0   Glucose, UA NEGATIVE NEGATIVE mg/dL   Hgb urine dipstick NEGATIVE NEGATIVE   Bilirubin Urine NEGATIVE NEGATIVE   Ketones, ur NEGATIVE NEGATIVE mg/dL   Protein, ur NEGATIVE NEGATIVE mg/dL   Nitrite NEGATIVE NEGATIVE   Leukocytes, UA NEGATIVE NEGATIVE  MRSA PCR Screening     Status: None   Collection Time: 04/05/17 11:48 PM  Result Value Ref Range   MRSA by PCR NEGATIVE NEGATIVE    Comment:        The GeneXpert MRSA Assay (FDA approved for NASAL specimens only), is one component of a comprehensive MRSA colonization surveillance program. It is not intended to diagnose MRSA infection nor to guide or monitor treatment for MRSA infections.   Comprehensive metabolic panel     Status: Abnormal   Collection Time: 04/06/17  6:16 AM  Result Value Ref Range    Sodium 137 135 - 145 mmol/L   Potassium 4.3 3.5 - 5.1  mmol/L    Comment: DELTA CHECK NOTED   Chloride 104 101 - 111 mmol/L   CO2 25 22 - 32 mmol/L   Glucose, Bld 100 (H) 65 - 99 mg/dL   BUN 14 6 - 20 mg/dL   Creatinine, Ser 0.59 (L) 0.61 - 1.24 mg/dL   Calcium 8.6 (L) 8.9 - 10.3 mg/dL   Total Protein 7.4 6.5 - 8.1 g/dL   Albumin 3.9 3.5 - 5.0 g/dL   AST 25 15 - 41 U/L   ALT 18 17 - 63 U/L   Alkaline Phosphatase 153 (H) 38 - 126 U/L   Total Bilirubin 0.6 0.3 - 1.2 mg/dL   GFR calc non Af Amer >60 >60 mL/min   GFR calc Af Amer >60 >60 mL/min    Comment: (NOTE) The eGFR has been calculated using the CKD EPI equation. This calculation has not been validated in all clinical situations. eGFR's persistently <60 mL/min signify possible Chronic Kidney Disease.    Anion gap 8 5 - 15  CBC WITH DIFFERENTIAL     Status: None   Collection Time: 04/06/17  6:16 AM  Result Value Ref Range   WBC 9.0 4.0 - 10.5 K/uL   RBC 4.70 4.22 - 5.81 MIL/uL   Hemoglobin 14.0 13.0 - 17.0 g/dL   HCT 42.3 39.0 - 52.0 %   MCV 90.0 78.0 - 100.0 fL   MCH 29.8 26.0 - 34.0 pg   MCHC 33.1 30.0 - 36.0 g/dL   RDW 13.2 11.5 - 15.5 %   Platelets 162 150 - 400 K/uL   Neutrophils Relative % 82 %   Neutro Abs 7.4 1.7 - 7.7 K/uL   Lymphocytes Relative 11 %   Lymphs Abs 1.0 0.7 - 4.0 K/uL   Monocytes Relative 6 %   Monocytes Absolute 0.5 0.1 - 1.0 K/uL   Eosinophils Relative 1 %   Eosinophils Absolute 0.1 0.0 - 0.7 K/uL   Basophils Relative 0 %   Basophils Absolute 0.0 0.0 - 0.1 K/uL    Dg Chest 2 View  Result Date: 04/05/2017 CLINICAL DATA:  71 year old male with severe lower abdominal pain. EXAM: CHEST  2 VIEW COMPARISON:  07/17/2012 FINDINGS: Cardiomediastinal silhouette is borderline enlarged but stable in size and configuration. There are low lung volumes with bronchovascular crowding and bibasilar atelectasis. No focal airspace opacities, pleural effusion or pneumothorax. No acute osseous abnormality.   Evaluation of the abdomen is limited. IMPRESSION: Low lung volumes with bronchovascular crowding and bibasilar atelectasis. Otherwise, no acute intrathoracic pathology. Electronically Signed   By: Kristopher Oppenheim M.D.   On: 04/05/2017 20:27   Ct Abdomen Pelvis W Contrast  Result Date: 04/05/2017 CLINICAL DATA:  Generalized abdominal pain and distention for 2 days. Previous history of small bowel obstruction, urinary tract infection, seizures, prostate cancer, hypertension, hydronephrosis, epilepsy. EXAM: CT ABDOMEN AND PELVIS WITH CONTRAST TECHNIQUE: Multidetector CT imaging of the abdomen and pelvis was performed using the standard protocol following bolus administration of intravenous contrast. CONTRAST:  132m ISOVUE-300 IOPAMIDOL (ISOVUE-300) INJECTION 61% COMPARISON:  10/04/2010 FINDINGS: Lower chest: Atelectasis in the lung bases. Hepatobiliary: No focal liver abnormality is seen. No gallstones, gallbladder wall thickening, or biliary dilatation. Pancreas: Unremarkable. No pancreatic ductal dilatation or surrounding inflammatory changes. Spleen: Normal in size without focal abnormality. Adrenals/Urinary Tract: No adrenal gland nodules. Kidneys are symmetrical. Nephrograms are homogeneous. Prominent extrarenal pelvis on the right, likely represent normal variation. No ureteral stone or hydroureter. Bladder is unremarkable. Stomach/Bowel: Stomach is unremarkable. Mildly dilated small  bowel with a few air-fluid levels. Distal bowel is decompressed. Transition zone is in the anterior mid abdomen at the level of the umbilicus. There is small bowel feces sign above the transition zone. Colon is decompressed. There appears to be a previous partial right hemicolectomy with ileal colonic anastomosis. Vascular/Lymphatic: Aortic atherosclerosis. No enlarged abdominal or pelvic lymph nodes. Reproductive: Prostate gland is enlarged. Metallic foreign bodies consistent with prostate seed implants or surgical clips.  Other: No free air or free fluid in the abdomen. Prominent visceral adipose tissues. Small periumbilical hernia containing fat. Musculoskeletal: Degenerative changes in the spine. No destructive bone lesions. IMPRESSION: 1. Low-grade or partial small bowel obstruction with transition zone in the anterior mid abdomen. 2. Aortic atherosclerosis. Electronically Signed   By: Lucienne Capers M.D.   On: 04/05/2017 21:20    ROS:  Pertinent items are noted in HPI.  Blood pressure 107/66, pulse 86, temperature 98.4 F (36.9 C), temperature source Oral, resp. rate 16, height 5' 10"  (1.778 m), weight 221 lb 12.5 oz (100.6 kg), SpO2 94 %. Physical Exam: Pleasant well-developed well-nourished white male no acute distress Head is normocephalic, atraumatic Lungs clear to auscultation with equal breath sounds bilaterally Heart examination reveals regular rate and rhythm without S3, S4, murmurs Abdomen is soft, nontender, nondistended.  He does have increased girth of the abdomen.  No hepatosplenomegaly, masses, or rigidity are noted.  CT scan report reviewed  Assessment/Plan: Impression: Partial small bowel obstruction, resolving Plan: As patient's bowel function is returning, will advance diet as tolerated.  No need for acute surgical intervention at this time.  Will follow with you.  Aviva Signs 04/06/2017, 11:32 AM

## 2017-04-06 NOTE — Progress Notes (Signed)
Continues to deny nausea and pain since drinking and eating jello.  IV fluids reduced to 75/hr

## 2017-04-06 NOTE — Progress Notes (Signed)
PT refuses tap water enema at this time. PT states he has been passing a lot of gas and believes he will have a bowel movement soon. Continue to monitor.

## 2017-04-06 NOTE — Progress Notes (Signed)
Patient seen and examined, database reviewed.  No family members at bedside.  He was admitted earlier today due to small bowel obstruction.  NG was never placed.  When seen this morning he states he has already significantly improved, feels like his abdominal distention is at baseline, has had a bowel movement earlier today.  Will advance diet.  Anticipate discharge home in 24 hours.  Dr. Arnoldo Morale is following, however do not anticipate acute surgical need for intervention.  Domingo Mend, MD [facility] Pager: [phone]

## 2017-04-06 NOTE — Plan of Care (Signed)
Denies nausea and pain.

## 2017-04-07 DIAGNOSIS — N400 Enlarged prostate without lower urinary tract symptoms: Secondary | ICD-10-CM

## 2017-04-07 DIAGNOSIS — G40909 Epilepsy, unspecified, not intractable, without status epilepticus: Secondary | ICD-10-CM

## 2017-04-07 DIAGNOSIS — I10 Essential (primary) hypertension: Secondary | ICD-10-CM

## 2017-04-07 DIAGNOSIS — E785 Hyperlipidemia, unspecified: Secondary | ICD-10-CM

## 2017-04-07 DIAGNOSIS — R748 Abnormal levels of other serum enzymes: Secondary | ICD-10-CM

## 2017-04-07 DIAGNOSIS — R7302 Impaired glucose tolerance (oral): Secondary | ICD-10-CM

## 2017-04-07 DIAGNOSIS — K5900 Constipation, unspecified: Secondary | ICD-10-CM

## 2017-04-07 LAB — CBC WITH DIFFERENTIAL/PLATELET
BASOS ABS: 0 10*3/uL (ref 0.0–0.1)
Basophils Relative: 0 %
EOS ABS: 0.2 10*3/uL (ref 0.0–0.7)
EOS PCT: 5 %
HCT: 39.8 % (ref 39.0–52.0)
HEMOGLOBIN: 12.9 g/dL — AB (ref 13.0–17.0)
LYMPHS ABS: 1.3 10*3/uL (ref 0.7–4.0)
LYMPHS PCT: 32 %
MCH: 29.5 pg (ref 26.0–34.0)
MCHC: 32.4 g/dL (ref 30.0–36.0)
MCV: 90.9 fL (ref 78.0–100.0)
Monocytes Absolute: 0.3 10*3/uL (ref 0.1–1.0)
Monocytes Relative: 7 %
NEUTROS ABS: 2.2 10*3/uL (ref 1.7–7.7)
Neutrophils Relative %: 56 %
PLATELETS: 145 10*3/uL — AB (ref 150–400)
RBC: 4.38 MIL/uL (ref 4.22–5.81)
RDW: 13.3 % (ref 11.5–15.5)
WBC: 4 10*3/uL (ref 4.0–10.5)

## 2017-04-07 MED ORDER — POLYETHYLENE GLYCOL 3350 17 GM/SCOOP PO POWD: 17.0000 g | Freq: Two times a day (BID) | ORAL | 0 refills | Status: AC

## 2017-04-07 NOTE — Care Management Note (Addendum)
Case Management Note  Patient Details  Name: RICAHRD SCHWAGER MRN: [id number] Date of Birth: 08/13/1945  Subjective/Objective:             Admitted with SBO. No Cm consult received, chart reviewed for DC planning needs. Pt is from home, lives alone, has insurance with drug coverage and PCP. No HH or DME needs pta.        Action/Plan: DC home today with self care. No CM needs noted prior to DC. On [facility] registry, not active. Ordered emmi transition calls for general DC.   Expected Discharge Date:  04/07/17               Expected Discharge Plan:  [place]  In-House Referral:  NA  Discharge planning Services  NA  Post Acute Care Choice:  NA Choice offered to:  NA  Status of Service:  Completed, signed off  Sherald Barge, RN 04/07/2017, 12:00 PM

## 2017-04-07 NOTE — Care Management Important Message (Signed)
Important Message  Patient Details  Name: Brent Suarez MRN: [id number] Date of Birth: 14-Nov-1945   Medicare Important Message Given:  Yes    Sherald Barge, RN 04/07/2017, 12:01 PM

## 2017-04-07 NOTE — Progress Notes (Signed)
Patient states understanding of discharge instructions.  

## 2017-04-07 NOTE — Progress Notes (Addendum)
PT refused Phenobarbital tablet of 97.2mg  provided by the hospital due to not looking like what he takes from home. Package and pill shown to PT to ensure it was the correct medication. Tablet wasted with second verifier and disposed of per protocol.

## 2017-04-07 NOTE — Discharge Instructions (Signed)
Follow with Primary MD  Luking, William S, MD  and other consultant's as instructed your Hospitalist MD ° °Please get a complete blood count and chemistry panel checked by your Primary MD at your next visit, and again as instructed by your Primary MD. ° °Get Medicines reviewed and adjusted: °Please take all your medications with you for your next visit with your Primary MD ° °Laboratory/radiological data: °Please request your Primary MD to go over all hospital tests and procedure/radiological results at the follow up, please ask your Primary MD to get all Hospital records sent to his/her office. ° °In some cases, they will be blood work, cultures and biopsy results pending at the time of your discharge. Please request that your primary care M.D. follows up on these results. ° °Also Note the following: °If you experience worsening of your admission symptoms, develop shortness of breath, life threatening emergency, suicidal or homicidal thoughts you must seek medical attention immediately by calling 911 or calling your MD immediately  if symptoms less severe. ° °You must read complete instructions/literature along with all the possible adverse reactions/side effects for all the Medicines you take and that have been prescribed to you. Take any new Medicines after you have completely understood and accpet all the possible adverse reactions/side effects.  ° °Do not drive when taking Pain medications or sleeping medications (Benzodaizepines) ° °Do not take more than prescribed Pain, Sleep and Anxiety Medications. It is not advisable to combine anxiety,sleep and pain medications without talking with your primary care practitioner ° °Special Instructions: If you have smoked or chewed Tobacco  in the last 2 yrs please stop smoking, stop any regular Alcohol  and or any Recreational drug use. ° °Wear Seat belts while driving. ° °Please note: °You were cared for by a hospitalist during your hospital stay. Once you are  discharged, your primary care physician will handle any further medical issues. Please note that NO REFILLS for any discharge medications will be authorized once you are discharged, as it is imperative that you return to your primary care physician (or establish a relationship with a primary care physician if you do not have one) for your post hospital discharge needs so that they can reassess your need for medications and monitor your lab values. ° ° ° ° °

## 2017-04-07 NOTE — Progress Notes (Signed)
Subjective: Patient tolerating regular diet well.  Had a large bowel movement over the past 24 hours.  No nausea or vomiting noted.  No abdominal pain noted.  Objective: Vital signs in last 24 hours: Temp:  [97.8 F (36.6 C)-98.6 F (37 C)] 97.8 F (36.6 C) (11/26 0523) Pulse Rate:  [72-76] 72 (11/26 0523) Resp:  [16-18] 16 (11/26 0523) BP: (115-126)/(64-84) 122/64 (11/26 0523) SpO2:  [94 %-96 %] 94 % (11/26 0523) Last BM Date: 04/06/17  Intake/Output from previous day: 11/25 0701 - 11/26 0700 In: 2300 [P.O.:360; I.V.:1940] Out: 1500 [Urine:1500] Intake/Output this shift: No intake/output data recorded.  General appearance: alert, cooperative and no distress GI: soft, non-tender; bowel sounds normal; no masses,  no organomegaly  Lab Results:  Recent Labs    04/06/17 0616 04/07/17 0551  WBC 9.0 4.0  HGB 14.0 12.9*  HCT 42.3 39.8  PLT 162 145*   BMET Recent Labs    04/05/17 1941 04/06/17 0616  NA 139 137  K 3.5 4.3  CL 102 104  CO2 28 25  GLUCOSE 101* 100*  BUN 16 14  CREATININE 0.81 0.59*  CALCIUM 9.2 8.6*   PT/INR No results for input(s): LABPROT, INR in the last 72 hours.  Studies/Results: Dg Chest 2 View  Result Date: 04/05/2017 CLINICAL DATA:  71 year old male with severe lower abdominal pain. EXAM: CHEST  2 VIEW COMPARISON:  07/17/2012 FINDINGS: Cardiomediastinal silhouette is borderline enlarged but stable in size and configuration. There are low lung volumes with bronchovascular crowding and bibasilar atelectasis. No focal airspace opacities, pleural effusion or pneumothorax. No acute osseous abnormality.  Evaluation of the abdomen is limited. IMPRESSION: Low lung volumes with bronchovascular crowding and bibasilar atelectasis. Otherwise, no acute intrathoracic pathology. Electronically Signed   By: Kristopher Oppenheim M.D.   On: 04/05/2017 20:27   Ct Abdomen Pelvis W Contrast  Result Date: 04/05/2017 CLINICAL DATA:  Generalized abdominal pain and  distention for 2 days. Previous history of small bowel obstruction, urinary tract infection, seizures, prostate cancer, hypertension, hydronephrosis, epilepsy. EXAM: CT ABDOMEN AND PELVIS WITH CONTRAST TECHNIQUE: Multidetector CT imaging of the abdomen and pelvis was performed using the standard protocol following bolus administration of intravenous contrast. CONTRAST:  183mL ISOVUE-300 IOPAMIDOL (ISOVUE-300) INJECTION 61% COMPARISON:  10/04/2010 FINDINGS: Lower chest: Atelectasis in the lung bases. Hepatobiliary: No focal liver abnormality is seen. No gallstones, gallbladder wall thickening, or biliary dilatation. Pancreas: Unremarkable. No pancreatic ductal dilatation or surrounding inflammatory changes. Spleen: Normal in size without focal abnormality. Adrenals/Urinary Tract: No adrenal gland nodules. Kidneys are symmetrical. Nephrograms are homogeneous. Prominent extrarenal pelvis on the right, likely represent normal variation. No ureteral stone or hydroureter. Bladder is unremarkable. Stomach/Bowel: Stomach is unremarkable. Mildly dilated small bowel with a few air-fluid levels. Distal bowel is decompressed. Transition zone is in the anterior mid abdomen at the level of the umbilicus. There is small bowel feces sign above the transition zone. Colon is decompressed. There appears to be a previous partial right hemicolectomy with ileal colonic anastomosis. Vascular/Lymphatic: Aortic atherosclerosis. No enlarged abdominal or pelvic lymph nodes. Reproductive: Prostate gland is enlarged. Metallic foreign bodies consistent with prostate seed implants or surgical clips. Other: No free air or free fluid in the abdomen. Prominent visceral adipose tissues. Small periumbilical hernia containing fat. Musculoskeletal: Degenerative changes in the spine. No destructive bone lesions. IMPRESSION: 1. Low-grade or partial small bowel obstruction with transition zone in the anterior mid abdomen. 2. Aortic atherosclerosis.  Electronically Signed   By: Oren Beckmann.D.  On: 04/05/2017 21:20    Anti-infectives: Anti-infectives (From admission, onward)   None      Assessment/Plan: Impression: Small bowel obstruction, resolved Plan: Okay for discharge from surgery standpoint.  Discussed with Dr. Wynetta Emery.  LOS: 2 days    Aviva Signs 04/07/2017

## 2017-04-07 NOTE — Discharge Summary (Signed)
Physician Discharge Summary  Brent Suarez JJK:[id number] DOB: 11/04/1945 DOA: 04/05/2017  PCP: Mikey Kirschner, MD  Admit date: 04/05/2017 Discharge date: 04/07/2017  Admitted From: Home  Disposition: Home   Recommendations for Outpatient Follow-up:  1. Follow up with PCP in 1 weeks 2. Please obtain BMP/CBC in one week 3. Please follow up on the following pending results: final culture data  Discharge Condition: STABLE   CODE STATUS: FULL    Brief Hospitalization Summary: Please see all hospital notes, images, labs for full details of the hospitalization. HPI: Brent Suarez is a 71 y.o. male with medical history significant of chronic kidney disease, urolithiasis with history of bilateral hydronephrosis, glucose intolerance, hyperlipidemia, hypertension, prostate cancer history, seizure disorder, history of SBO x2 in the past who is coming to the emergency department with complaints of abdominal pain for the past 2-day associated with constipation despite his use of Miralax at home.  He denies fever, chills, nausea, emesis, diarrhea, melena or hematochezia.  He denies dysuria, frequency or hematuria.  No chest pain, dyspnea, dizziness, diaphoresis, palpitations, PND, orthopnea or pitting edema of the lower extremities.  He denies blurry vision, polyuria, polydipsia or polyphagia.  ED Course: Initial vital signs temperature 36.5C, pulse 68, respirations 18, blood pressure 140/94 mmHg and O2 sat 96%.  The patient was started on IV fluids, given morphine and Zofran in the emergency department.  His CBC was normal with WBCs of 8.0 with a normal differential, hemoglobin 14.4 g/dL and platelets 167.  Lactic acid, troponin and lipase were normal.  His CMP shows a glucose of 101 mg/dL, total protein 8.3 g/dL and alkaline phosphatase 175 u/L.  All other chemistry values are normal.  Magnesium is pending at this time.  Imaging: CT abdomen/pelvis shows a low-grade or partial small bowel  obstruction with transition zone in the anterior mid abdomen.  There was aortic atherosclerosis.  Please see images and full radiology report for further detail.   The patient was admitted and initially made n.p.o.  He was seen by general surgery.  At that time his partial small bowel obstruction was resolving and his diet was advanced.  There was no need for acute surgical intervention at the time.  He was followed by surgery.  He had a large bowel movement and surgery felt like it was okay for him to discharge home.  He is tolerated a soft diet very well.  He had refused some of his phenobarbital tablets because they look different than what he would take at home.  He is being discharged in a stable condition to follow-up outpatient.  He will be continued on a soft diet for the next several weeks.  He will also be continued on MiraLAX stool softeners twice daily.  Discharge Diagnoses:  Principal Problem:   SBO (small bowel obstruction) (HCC) Active Problems:   Seizure disorder (HCC)   Prostate hypertrophy   Hyperlipidemia LDL goal <130   Elevated alkaline phosphatase level   Hypertension   Constipation   Glucose intolerance (impaired glucose tolerance)  Discharge Instructions: Discharge Instructions    Call MD for:  extreme fatigue   Complete by:  As directed    Call MD for:  persistant dizziness or light-headedness   Complete by:  As directed    Call MD for:  persistant nausea and vomiting   Complete by:  As directed    Call MD for:  severe uncontrolled pain   Complete by:  As directed    Diet -  low sodium heart healthy   Complete by:  As directed    Increase activity slowly   Complete by:  As directed      Allergies as of 04/07/2017   No Known Allergies     Medication List    TAKE these medications   aspirin 81 MG tablet Take 81 mg by mouth every evening.   cholecalciferol 1000 units tablet Commonly known as:  VITAMIN D Take 1 tablet (1,000 Units total) by mouth at  bedtime.   Fish Oil 1000 MG Caps Take 1 capsule by mouth at bedtime.   PHENObarbital 30 MG tablet Commonly known as:  LUMINAL Take 3 tablets (90 mg total) by mouth at bedtime.   phenytoin 100 MG ER capsule Commonly known as:  DILANTIN Take 1 capsule (100 mg total) by mouth 3 (three) times daily. What changed:    how much to take  when to take this   polyethylene glycol powder powder Commonly known as:  GLYCOLAX/MIRALAX Take 17 g by mouth 2 (two) times daily. What changed:    when to take this  reasons to take this   simvastatin 20 MG tablet Commonly known as:  ZOCOR Take 1 tablet (20 mg total) by mouth daily.   tamsulosin 0.4 MG Caps capsule Commonly known as:  FLOMAX take 1 capsule once daily with food What changed:    how much to take  how to take this  when to take this  additional instructions   vitamin C 500 MG tablet Commonly known as:  ASCORBIC ACID Take 500 mg by mouth at bedtime.      Follow-up Information    Mikey Kirschner, MD. Schedule an appointment as soon as possible for a visit in 1 week(s).   Specialty:  Family Medicine Contact information: [place] [address] [phone]          No Known Allergies Current Discharge Medication List    CONTINUE these medications which have CHANGED   Details  polyethylene glycol powder (GLYCOLAX/MIRALAX) powder Take 17 g by mouth 2 (two) times daily. Qty: 255 g, Refills: 0      CONTINUE these medications which have NOT CHANGED   Details  aspirin 81 MG tablet Take 81 mg by mouth every evening.     Omega-3 Fatty Acids (FISH OIL) 1000 MG CAPS Take 1 capsule by mouth at bedtime.    PHENObarbital (LUMINAL) 30 MG tablet Take 3 tablets (90 mg total) by mouth at bedtime. Qty: 270 tablet, Refills: 1    phenytoin (DILANTIN) 100 MG ER capsule Take 1 capsule (100 mg total) by mouth 3 (three) times daily. Qty: 270 capsule, Refills: 1    simvastatin (ZOCOR) 20 MG tablet  Take 1 tablet (20 mg total) by mouth daily. Qty: 90 tablet, Refills: 1    tamsulosin (FLOMAX) 0.4 MG CAPS capsule take 1 capsule once daily with food Qty: 90 capsule, Refills: 1    vitamin C (ASCORBIC ACID) 500 MG tablet Take 500 mg by mouth at bedtime.    cholecalciferol (VITAMIN D) 1000 units tablet Take 1 tablet (1,000 Units total) by mouth at bedtime. Qty: 90 tablet, Refills: 1        Procedures/Studies: Dg Chest 2 View  Result Date: 04/05/2017 CLINICAL DATA:  71 year old male with severe lower abdominal pain. EXAM: CHEST  2 VIEW COMPARISON:  07/17/2012 FINDINGS: Cardiomediastinal silhouette is borderline enlarged but stable in size and configuration. There are low lung volumes with bronchovascular crowding  and bibasilar atelectasis. No focal airspace opacities, pleural effusion or pneumothorax. No acute osseous abnormality.  Evaluation of the abdomen is limited. IMPRESSION: Low lung volumes with bronchovascular crowding and bibasilar atelectasis. Otherwise, no acute intrathoracic pathology. Electronically Signed   By: Kristopher Oppenheim M.D.   On: 04/05/2017 20:27   Ct Abdomen Pelvis W Contrast  Result Date: 04/05/2017 CLINICAL DATA:  Generalized abdominal pain and distention for 2 days. Previous history of small bowel obstruction, urinary tract infection, seizures, prostate cancer, hypertension, hydronephrosis, epilepsy. EXAM: CT ABDOMEN AND PELVIS WITH CONTRAST TECHNIQUE: Multidetector CT imaging of the abdomen and pelvis was performed using the standard protocol following bolus administration of intravenous contrast. CONTRAST:  134mL ISOVUE-300 IOPAMIDOL (ISOVUE-300) INJECTION 61% COMPARISON:  10/04/2010 FINDINGS: Lower chest: Atelectasis in the lung bases. Hepatobiliary: No focal liver abnormality is seen. No gallstones, gallbladder wall thickening, or biliary dilatation. Pancreas: Unremarkable. No pancreatic ductal dilatation or surrounding inflammatory changes. Spleen: Normal in size  without focal abnormality. Adrenals/Urinary Tract: No adrenal gland nodules. Kidneys are symmetrical. Nephrograms are homogeneous. Prominent extrarenal pelvis on the right, likely represent normal variation. No ureteral stone or hydroureter. Bladder is unremarkable. Stomach/Bowel: Stomach is unremarkable. Mildly dilated small bowel with a few air-fluid levels. Distal bowel is decompressed. Transition zone is in the anterior mid abdomen at the level of the umbilicus. There is small bowel feces sign above the transition zone. Colon is decompressed. There appears to be a previous partial right hemicolectomy with ileal colonic anastomosis. Vascular/Lymphatic: Aortic atherosclerosis. No enlarged abdominal or pelvic lymph nodes. Reproductive: Prostate gland is enlarged. Metallic foreign bodies consistent with prostate seed implants or surgical clips. Other: No free air or free fluid in the abdomen. Prominent visceral adipose tissues. Small periumbilical hernia containing fat. Musculoskeletal: Degenerative changes in the spine. No destructive bone lesions. IMPRESSION: 1. Low-grade or partial small bowel obstruction with transition zone in the anterior mid abdomen. 2. Aortic atherosclerosis. Electronically Signed   By: Lucienne Capers M.D.   On: 04/05/2017 21:20      Subjective: Pt is awake, alert, eating and had large BM wants to go home.   Discharge Exam: Vitals:   04/06/17 2206 04/07/17 0523  BP:  122/64  Pulse:  72  Resp:  16  Temp: 98.6 F (37 C) 97.8 F (36.6 C)  SpO2:  94%   Vitals:   04/06/17 1300 04/06/17 2012 04/06/17 2206 04/07/17 0523  BP: 115/68 126/84  122/64  Pulse: 74 76  72  Resp: 18 18  16   Temp: 98.3 F (36.8 C)  98.6 F (37 C) 97.8 F (36.6 C)  TempSrc: Oral  Oral Oral  SpO2: 94% 96%  94%  Weight:      Height:       General: Pt is alert, awake, not in acute distress Cardiovascular:  Normal S1/S2 +, no rubs, no gallops Respiratory: CTA bilaterally, no wheezing, no  rhonchi Abdominal: Soft, NT, ND, bowel sounds + Extremities: no edema, no cyanosis   The results of significant diagnostics from this hospitalization (including imaging, microbiology, ancillary and laboratory) are listed below for reference.     Microbiology: Recent Results (from the past 240 hour(s))  MRSA PCR Screening     Status: None   Collection Time: 04/05/17 11:48 PM  Result Value Ref Range Status   MRSA by PCR NEGATIVE NEGATIVE Final    Comment:        The GeneXpert MRSA Assay (FDA approved for NASAL specimens only), is one component of a comprehensive  MRSA colonization surveillance program. It is not intended to diagnose MRSA infection nor to guide or monitor treatment for MRSA infections.      Labs: BNP (last 3 results) No results for input(s): BNP in the last 8760 hours. Basic Metabolic Panel: Recent Labs  Lab 04/05/17 1941 04/05/17 1954 04/06/17 0616  NA 139  --  137  K 3.5  --  4.3  CL 102  --  104  CO2 28  --  25  GLUCOSE 101*  --  100*  BUN 16  --  14  CREATININE 0.81  --  0.59*  CALCIUM 9.2  --  8.6*  MG  --  1.9  --    Liver Function Tests: Recent Labs  Lab 04/05/17 1941 04/06/17 0616  AST 21 25  ALT 17 18  ALKPHOS 175* 153*  BILITOT 0.3 0.6  PROT 8.3* 7.4  ALBUMIN 4.7 3.9   Recent Labs  Lab 04/05/17 1941  LIPASE 34   No results for input(s): AMMONIA in the last 168 hours. CBC: Recent Labs  Lab 04/05/17 1941 04/06/17 0616 04/07/17 0551  WBC 8.0 9.0 4.0  NEUTROABS 5.5 7.4 2.2  HGB 14.4 14.0 12.9*  HCT 44.1 42.3 39.8  MCV 89.8 90.0 90.9  PLT 167 162 145*   Cardiac Enzymes: Recent Labs  Lab 04/05/17 1941  TROPONINI <0.03   BNP: Invalid input(s): POCBNP CBG: No results for input(s): GLUCAP in the last 168 hours. D-Dimer No results for input(s): DDIMER in the last 72 hours. Hgb A1c No results for input(s): HGBA1C in the last 72 hours. Lipid Profile No results for input(s): CHOL, HDL, LDLCALC, TRIG, CHOLHDL,  LDLDIRECT in the last 72 hours. Thyroid function studies No results for input(s): TSH, T4TOTAL, T3FREE, THYROIDAB in the last 72 hours.  Invalid input(s): FREET3 Anemia work up No results for input(s): VITAMINB12, FOLATE, FERRITIN, TIBC, IRON, RETICCTPCT in the last 72 hours. Urinalysis    Component Value Date/Time   COLORURINE YELLOW 04/05/2017 2245   APPEARANCEUR CLEAR 04/05/2017 2245   LABSPEC 1.038 (H) 04/05/2017 2245   PHURINE 6.0 04/05/2017 2245   GLUCOSEU NEGATIVE 04/05/2017 2245   HGBUR NEGATIVE 04/05/2017 2245   BILIRUBINUR NEGATIVE 04/05/2017 2245   KETONESUR NEGATIVE 04/05/2017 2245   PROTEINUR NEGATIVE 04/05/2017 2245   UROBILINOGEN 0.2 02/13/2009 2348   NITRITE NEGATIVE 04/05/2017 2245   LEUKOCYTESUR NEGATIVE 04/05/2017 2245   Sepsis Labs Invalid input(s): PROCALCITONIN,  WBC,  LACTICIDVEN Microbiology Recent Results (from the past 240 hour(s))  MRSA PCR Screening     Status: None   Collection Time: 04/05/17 11:48 PM  Result Value Ref Range Status   MRSA by PCR NEGATIVE NEGATIVE Final    Comment:        The GeneXpert MRSA Assay (FDA approved for NASAL specimens only), is one component of a comprehensive MRSA colonization surveillance program. It is not intended to diagnose MRSA infection nor to guide or monitor treatment for MRSA infections.     Time coordinating discharge: 35 mins  SIGNED:  Irwin Brakeman, MD  [facility] 04/07/2017, 11:53 AM Pager [phone]  If 7PM-7AM, please contact night-coverage [email or web address] Password [email or web address]

## 2017-04-08 ENCOUNTER — Telehealth: Payer: Self-pay | Admitting: *Deleted

## 2017-04-08 NOTE — Telephone Encounter (Signed)
Call pt, make sure doing better, f u with me late in the week or carolyn next wk per dr Richardson Landry.  Called pt he is doing better. Transferred to front to schedule hospital follow up end of the week with dr Richardson Landry.

## 2017-04-10 ENCOUNTER — Ambulatory Visit (INDEPENDENT_AMBULATORY_CARE_PROVIDER_SITE_OTHER): Payer: Medicare Other | Admitting: Family Medicine

## 2017-04-10 ENCOUNTER — Encounter: Payer: Self-pay | Admitting: Family Medicine

## 2017-04-10 ENCOUNTER — Other Ambulatory Visit: Payer: Self-pay

## 2017-04-10 VITALS — BP 130/80 | Ht 68.0 in | Wt 217.0 lb

## 2017-04-10 DIAGNOSIS — K56609 Unspecified intestinal obstruction, unspecified as to partial versus complete obstruction: Secondary | ICD-10-CM

## 2017-04-10 NOTE — Progress Notes (Signed)
   Subjective:    Patient ID: Brent Suarez, male    DOB: February 05, 1946, 71 y.o.   MRN: [id number]  HPI Patient is here today for a follow up on recent  Hospitalization for a intestinal blockage from Saturday. He states he feels better.  Pt now feeling much better  Had sig pain and was in the hosp for sbo. Dr Arnoldo Morale montiore   Patient notes abdominal pain is no longer present.  Full hospital records reviewed in presence Dr. Arnoldo Morale saw after the patient in the hospital.  Now on MiraLAX which has helped his constipation tendencies    Review of Systems No headache, no major weight loss or weight gain, no chest pain no back pain abdominal pain no change in bowel habits complete ROS otherwise negative     Objective:   Physical Exam  Alert and oriented, vitals reviewed and stable, NAD ENT-TM's and ext canals WNL bilat via otoscopic exam Soft palate, tonsils and post pharynx WNL via oropharyngeal exam Neck-symmetric, no masses; thyroid nonpalpable and nontender Pulmonary-no tachypnea or accessory muscle use; Clear without wheezes via auscultation Card--no abnrml murmurs, rhythm reg and rate WNL Carotid pulses symmetric, without bruits Abdominal exam benign      Assessment & Plan:  Impression 1 small bowel obstruction clinically resolved.  Discussed with patient.  Warning signs are follow-up regular appointment.

## 2017-04-10 NOTE — Patient Outreach (Signed)
[place] [facility]) Care Management  04/10/2017  Brent Suarez 06-02-1945 [id number]      EMMI- GENERAL DISCHARGE RED ON EMMI ALERT Day # 1 Date: 04/09/17 Red Alert Reason: " Got discharge papers? No  Know who to call about changes in condition? No   Unfilled prescriptions? Yes Able to fill today/tomorrow? No"     Outreach attempt # 1 to patient. Spoke with a male who states that patient was not at home. He freely reported patient was " on the [place] wing of [facility] with his spouse who is in the hospital."       Plan: RN CM will make outreach attempt to patient within one business day.    Enzo Montgomery, RN,BSN,CCM [place] [facility] Telephonic Care Management Coordinator Direct Phone: [phone] Toll Free: [phone] Fax: [phone]

## 2017-04-11 ENCOUNTER — Other Ambulatory Visit: Payer: Self-pay

## 2017-04-11 NOTE — Patient Outreach (Signed)
[place] [facility]) Care Management  04/11/2017  Brent Suarez 1945/12/01 [id number]     EMMI- GENERAL DISCHARGE RED ON EMMI ALERT Day # 1 Date: 04/09/17 Red Alert Reason: " Got discharge papers? No  Know who to call about changes in condition? No   Unfilled prescriptions? Yes Able to fill today/tomorrow? No"     Outreach attempt #2 to patient. No answer at present and unable to leave voicemail message.      Plan: RN CM will send unsuccessful outreach letter to patient and close case if no response within 10 business days.    Enzo Montgomery, RN,BSN,CCM [place] [facility] Telephonic Care Management Coordinator Direct Phone: [phone] Toll Free: [phone] Fax: [phone]

## 2017-04-17 ENCOUNTER — Other Ambulatory Visit: Payer: Self-pay

## 2017-04-24 ENCOUNTER — Other Ambulatory Visit: Payer: Self-pay

## 2017-04-24 NOTE — Patient Outreach (Signed)
[place] [facility]) Care Management  04/24/2017  Brent Suarez 23-Apr-1946 [id number]   EMMI-GENERAL DISCHARGE RED ON EMMI ALERT Day #1 Date:04/09/17 Red Alert Reason:" Got discharge papers? No Know who to call about changes in condition? No Unfilled prescriptions? Yes Able to fill today/tomorrow? No"     Multiple attempts to establish contact with patient without success. No response from letter mailed to patient. Case is being closed at this time.      Plan: RN CM will notify [facility] administrative assistant of case status.   Enzo Montgomery, RN,BSN,CCM [place] [facility] Telephonic Care Management Coordinator Direct Phone: [phone] Toll Free: [phone] Fax: [phone]

## 2017-07-22 ENCOUNTER — Encounter: Payer: Self-pay | Admitting: Family Medicine

## 2017-07-22 ENCOUNTER — Ambulatory Visit (INDEPENDENT_AMBULATORY_CARE_PROVIDER_SITE_OTHER): Payer: Medicare Other | Admitting: Family Medicine

## 2017-07-22 VITALS — BP 130/84 | Ht 68.0 in | Wt 221.0 lb

## 2017-07-22 DIAGNOSIS — M546 Pain in thoracic spine: Secondary | ICD-10-CM

## 2017-07-22 NOTE — Progress Notes (Signed)
   Subjective:    Patient ID: Brent Suarez, male    DOB: Nov 08, 1945, 72 y.o.   MRN: [id number]  HPI Patient is here today with complaints of back pain.He states it in the upper middle of back.Sharp at times.Off and on for 2 weeks now. Not taking anything for it. Does not remember hurting it.He states he has an occasional cough in the am.  In between shoulder pblades painful , achey at itmes  Between the shoulder blades pos pain when moving'  No meds yet      Review of Systems No headache, no major weight loss or weight gain, no chest pain no back pain abdominal pain no change in bowel habits complete ROS otherwise negative     Objective:   Physical Exam Paraspinal pain tendernessAlert vitals stable, NAD. Blood pressure good on repeat. HEENT normal. Lungs clear. Heart regular rate and rhythm. Left side greater than right paraspinal tenderness   impression trapezius strain       Assessment & Plan:  Discussed local measures discussed anti-inflammatory medicine recommended hold off on x-rays rationale discussed

## 2017-08-28 ENCOUNTER — Other Ambulatory Visit: Payer: Self-pay | Admitting: Family Medicine

## 2017-09-09 ENCOUNTER — Ambulatory Visit (INDEPENDENT_AMBULATORY_CARE_PROVIDER_SITE_OTHER): Payer: Medicare Other | Admitting: Family Medicine

## 2017-09-09 ENCOUNTER — Encounter: Payer: Self-pay | Admitting: Family Medicine

## 2017-09-09 VITALS — BP 128/82 | Ht 68.0 in | Wt 224.8 lb

## 2017-09-09 DIAGNOSIS — R7303 Prediabetes: Secondary | ICD-10-CM

## 2017-09-09 DIAGNOSIS — E785 Hyperlipidemia, unspecified: Secondary | ICD-10-CM

## 2017-09-09 DIAGNOSIS — Z125 Encounter for screening for malignant neoplasm of prostate: Secondary | ICD-10-CM | POA: Diagnosis not present

## 2017-09-09 DIAGNOSIS — C61 Malignant neoplasm of prostate: Secondary | ICD-10-CM

## 2017-09-09 DIAGNOSIS — I10 Essential (primary) hypertension: Secondary | ICD-10-CM | POA: Diagnosis not present

## 2017-09-09 DIAGNOSIS — Z0000 Encounter for general adult medical examination without abnormal findings: Secondary | ICD-10-CM | POA: Diagnosis not present

## 2017-09-09 DIAGNOSIS — Z79899 Other long term (current) drug therapy: Secondary | ICD-10-CM | POA: Diagnosis not present

## 2017-09-09 DIAGNOSIS — G40909 Epilepsy, unspecified, not intractable, without status epilepticus: Secondary | ICD-10-CM

## 2017-09-09 MED ORDER — SIMVASTATIN 20 MG PO TABS: 20.0000 mg | ORAL_TABLET | Freq: Every day | ORAL | 1 refills | Status: DC

## 2017-09-09 MED ORDER — PHENYTOIN SODIUM EXTENDED 100 MG PO CAPS: 300.0000 mg | ORAL_CAPSULE | Freq: Every day | ORAL | 11 refills | Status: DC

## 2017-09-09 MED ORDER — PHENOBARBITAL 30 MG PO TABS: 90.0000 mg | ORAL_TABLET | Freq: Every day | ORAL | 3 refills | Status: DC

## 2017-09-09 MED ORDER — TAMSULOSIN HCL 0.4 MG PO CAPS: 0.4000 mg | ORAL_CAPSULE | Freq: Every evening | ORAL | 11 refills | Status: DC

## 2017-09-09 NOTE — Progress Notes (Signed)
Subjective:    Patient ID: Brent Suarez, male    DOB: [birth year], 72 y.o.   MRN: [id number]  HPI AWV- Annual Wellness Visit  The patient was seen for their annual wellness visit. The patient's past medical history, surgical history, and family history were reviewed. Pertinent vaccines were reviewed ( tetanus, pneumonia, shingles, flu) The patient's medication list was reviewed and updated.  The height and weight were entered.  BMI recorded in electronic record elsewhere  Cognitive screening was completed. Outcome of Mini - Cog: pass   Falls /depression screening electronically recorded within record elsewhere  Current tobacco usage:no (All patients who use tobacco were given written and verbal information on quitting)  Recent listing of emergency department/hospitalizations over the past year were reviewed.  current specialist the patient sees on a regular basis: not currently; needs to follow up with GI    Medicare annual wellness visit patient questionnaire was reviewed.  A written screening schedule for the patient for the next 5-10 years was given. Appropriate discussion of followup regarding next visit was discussed.   Patient continues to take lipid medication regularly. No obvious side effects from it. Generally does not miss a dose. Prior blood work results are reviewed with patient. Patient continues to work on fat intake in diet  Seizure disorder Testing patient compliant with medications.  Only possible side effect is some information from Dilantin.  Is not missing those.  Has not had a seizure for many years.  Positive history of prostate cancer.  Treated with radiation.  Patient has not followed up appropriately with his specialist   Review of Systems  Constitutional: Negative for activity change, appetite change and fever.  HENT: Negative for congestion and rhinorrhea.   Eyes: Negative for discharge.  Respiratory: Negative for cough and wheezing.     Cardiovascular: Negative for chest pain.  Gastrointestinal: Negative for abdominal pain, blood in stool and vomiting.  Genitourinary: Negative for difficulty urinating and frequency.  Musculoskeletal: Negative for neck pain.  Skin: Negative for rash.  Allergic/Immunologic: Negative for environmental allergies and food allergies.  Neurological: Negative for weakness and headaches.  Psychiatric/Behavioral: Negative for agitation.  All other systems reviewed and are negative.      Objective:   Physical Exam  Constitutional: He appears well-developed and well-nourished.  HENT:  Head: Normocephalic and atraumatic.  Right Ear: External ear normal.  Left Ear: External ear normal.  Nose: Nose normal.  Mouth/Throat: Oropharynx is clear and moist.  Eyes: Pupils are equal, round, and reactive to light. EOM are normal.  Neck: Normal range of motion. Neck supple. No thyromegaly present.  Cardiovascular: Normal rate, regular rhythm and normal heart sounds.  No murmur heard. Pulmonary/Chest: Effort normal and breath sounds normal. No respiratory distress. He has no wheezes.  Abdominal: Soft. Bowel sounds are normal. He exhibits no distension and no mass. There is no tenderness.  Genitourinary: Penis normal.  Musculoskeletal: Normal range of motion. He exhibits no edema.  Lymphadenopathy:    He has no cervical adenopathy.  Neurological: He is alert. He exhibits normal muscle tone.  Skin: Skin is warm and dry. No erythema.  Psychiatric: He has a normal mood and affect. His behavior is normal. Judgment normal.  Vitals reviewed.         Assessment & Plan:  2016 neg colon, next due 20211 impression wellness exam.  Up-to-date on vaccines.  Diet exercise discussed.  Colonoscopy due 2021  Seizure disorder.  Clinically stable.  To maintain same meds compliance  discussed  Hyperlipidemia.  Control discussed.  Time to do blood work discussed compliance discussed.  History of a cancer.  Has not  follow-up as appropriate.  We will do the proper blood work, however patient needs to get back to his urologist.  Discussed.  Patient to work on setting this up.  Prediabetes has uncertain we will check results at this time  Medications refilled further recommendations as noted above

## 2017-09-10 LAB — BASIC METABOLIC PANEL
BUN/Creatinine Ratio: 18 (ref 10–24)
BUN: 13 mg/dL (ref 8–27)
CALCIUM: 9.3 mg/dL (ref 8.6–10.2)
CO2: 26 mmol/L (ref 20–29)
CREATININE: 0.74 mg/dL — AB (ref 0.76–1.27)
Chloride: 101 mmol/L (ref 96–106)
GFR, EST AFRICAN AMERICAN: 108 mL/min/{1.73_m2} (ref >59)
GFR, EST NON AFRICAN AMERICAN: 93 mL/min/{1.73_m2} (ref >59)
Glucose: 99 mg/dL (ref 65–99)
Potassium: 4.6 mmol/L (ref 3.5–5.2)
Sodium: 140 mmol/L (ref 134–144)

## 2017-09-10 LAB — LIPID PANEL
Chol/HDL Ratio: 4.6 ratio (ref 0.0–5.0)
Cholesterol, Total: 152 mg/dL (ref 100–199)
HDL: 33 mg/dL — ABNORMAL LOW (ref >39)
LDL CALC: 77 mg/dL (ref 0–99)
Triglycerides: 211 mg/dL — ABNORMAL HIGH (ref 0–149)
VLDL CHOLESTEROL CAL: 42 mg/dL — AB (ref 5–40)

## 2017-09-10 LAB — HEPATIC FUNCTION PANEL
ALBUMIN: 4.5 g/dL (ref 3.5–4.8)
ALT: 16 IU/L (ref 0–44)
AST: 19 IU/L (ref 0–40)
Alkaline Phosphatase: 191 IU/L — ABNORMAL HIGH (ref 39–117)
BILIRUBIN TOTAL: 0.2 mg/dL (ref 0.0–1.2)
Bilirubin, Direct: 0.08 mg/dL (ref 0.00–0.40)
TOTAL PROTEIN: 7.4 g/dL (ref 6.0–8.5)

## 2017-09-10 LAB — PSA, TOTAL AND FREE
PSA FREE: 0.05 ng/mL
PSA, Free Pct: 25 %
Prostate Specific Ag, Serum: 0.2 ng/mL (ref 0.0–4.0)

## 2017-09-24 ENCOUNTER — Encounter: Payer: Self-pay | Admitting: Family Medicine

## 2017-10-07 ENCOUNTER — Ambulatory Visit (INDEPENDENT_AMBULATORY_CARE_PROVIDER_SITE_OTHER): Payer: Medicare Other | Admitting: Urology

## 2017-10-07 DIAGNOSIS — C61 Malignant neoplasm of prostate: Secondary | ICD-10-CM | POA: Diagnosis not present

## 2017-10-07 DIAGNOSIS — Z8546 Personal history of malignant neoplasm of prostate: Secondary | ICD-10-CM | POA: Diagnosis not present

## 2017-10-09 ENCOUNTER — Other Ambulatory Visit: Payer: Self-pay | Admitting: Family Medicine

## 2017-10-10 NOTE — Telephone Encounter (Signed)
stwelve mo ok

## 2017-12-30 IMAGING — DX DG CHEST 2V
2 series · 2 of 2 positions shown · non-contrast
Comparison: 07/17/2012

CLINICAL DATA: 71-year-old male with severe lower abdominal pain.

EXAM:
CHEST  2 VIEW

[chest pa]
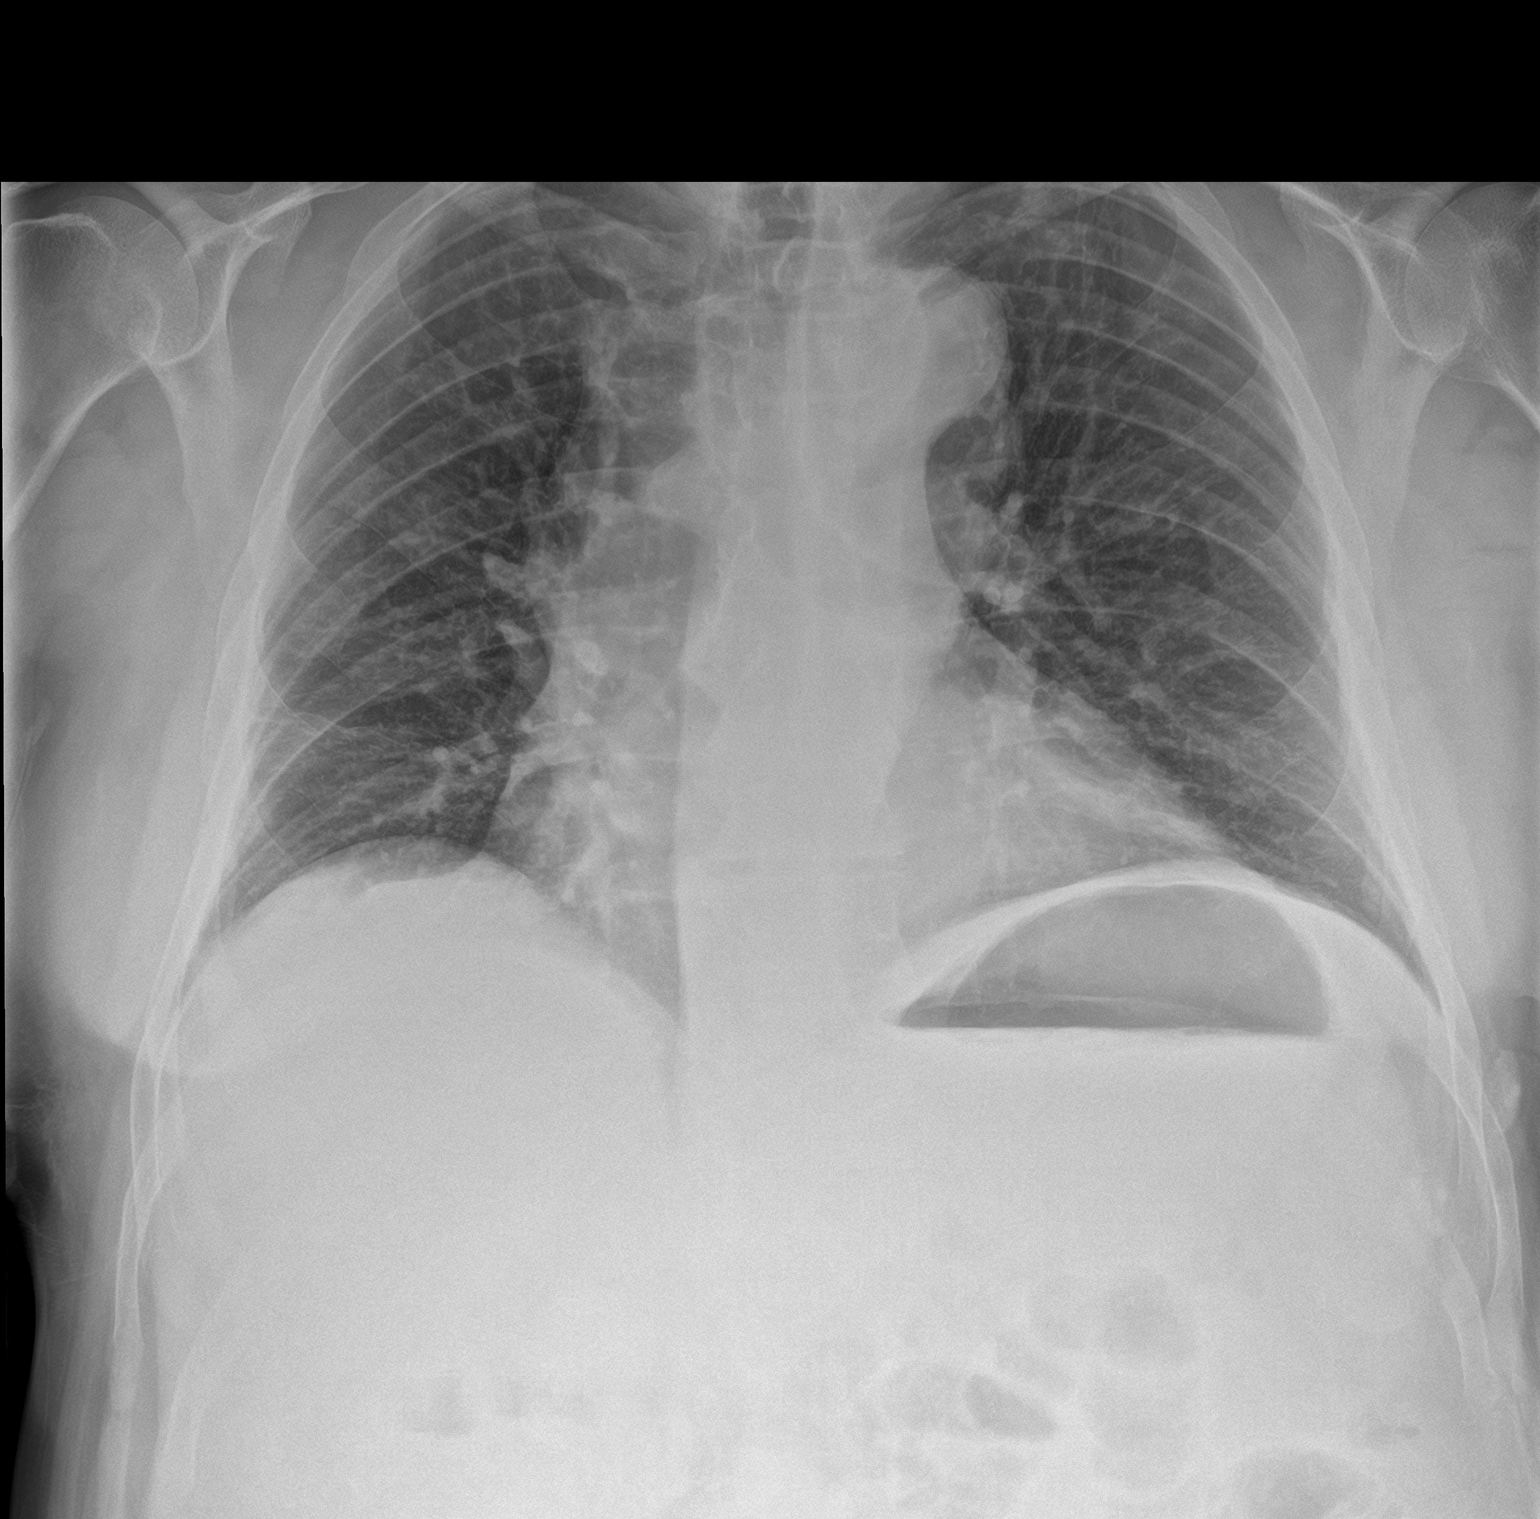

[chest lat]
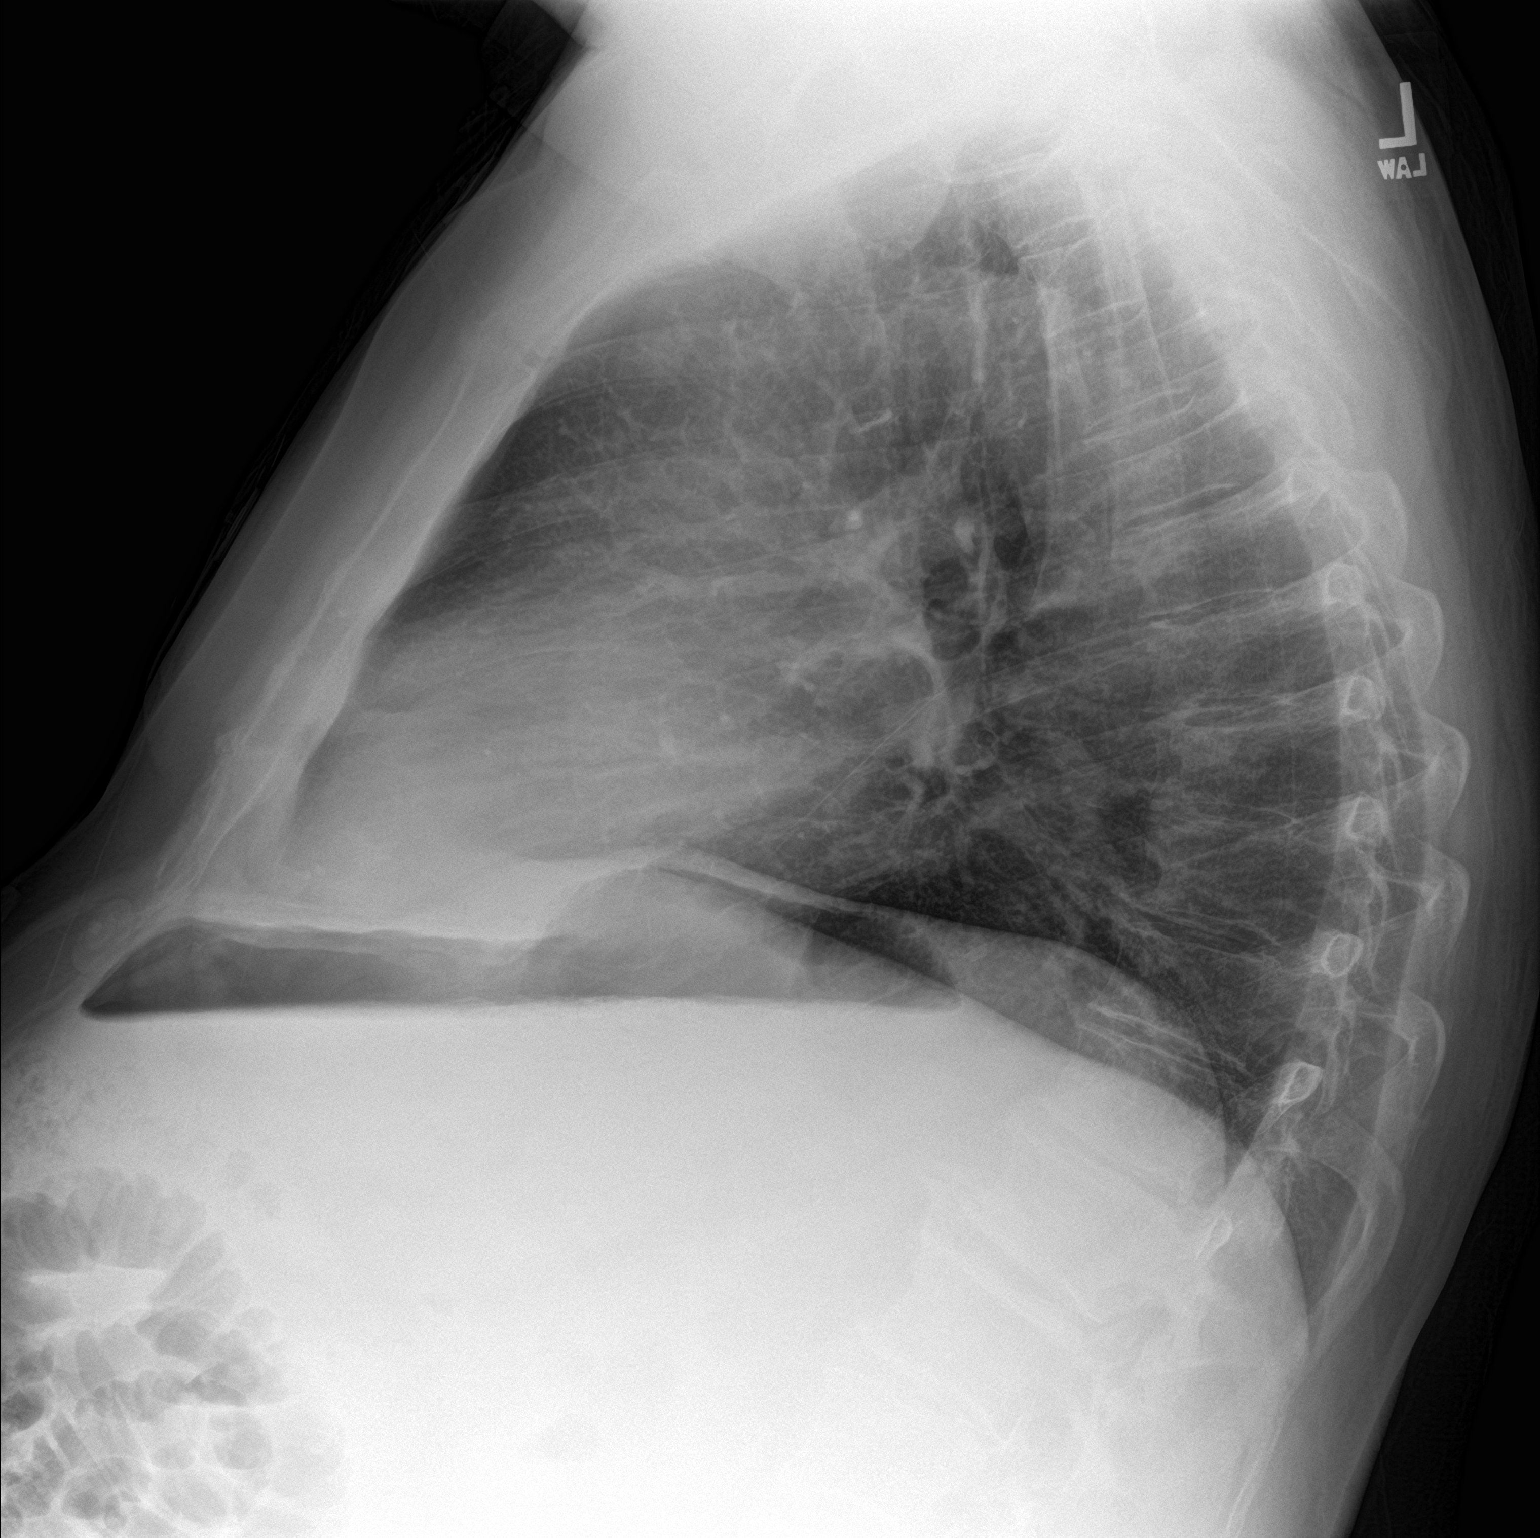

[2 of 2 positions shown; findings below may reference images not displayed]

FINDINGS: Cardiomediastinal silhouette is borderline enlarged but stable in
size and configuration. There are low lung volumes with
bronchovascular crowding and bibasilar atelectasis. No focal
airspace opacities, pleural effusion or pneumothorax.

No acute osseous abnormality.  Evaluation of the abdomen is limited.
IMPRESSION: Low lung volumes with bronchovascular crowding and bibasilar
atelectasis. Otherwise, no acute intrathoracic pathology.

## 2017-12-30 IMAGING — CT CT ABD-PELV W/ CM
2 of 5 series · 16 of 46 positions shown, 18 images · IV contrast (Isovue)
Comparison: 10/04/2010

CLINICAL DATA: Generalized abdominal pain and distention for 2
days. Previous history of small bowel obstruction, urinary tract
infection, seizures, prostate cancer, hypertension, hydronephrosis,
epilepsy.

EXAM:
CT ABDOMEN AND PELVIS WITH CONTRAST
TECHNIQUE: Multidetector CT imaging of the abdomen and pelvis was performed
using the standard protocol following bolus administration of
intravenous contrast.
CONTRAST:  100mL [id number] IOPAMIDOL (UWD7R9-2WW) INJECTION 61%

[Series 2: axial st · axial · 0.86mm/px · z∈[-534,-94]mm · 13 of 100 slices shown, 15 images]
[im 6/100  soft-tissue]
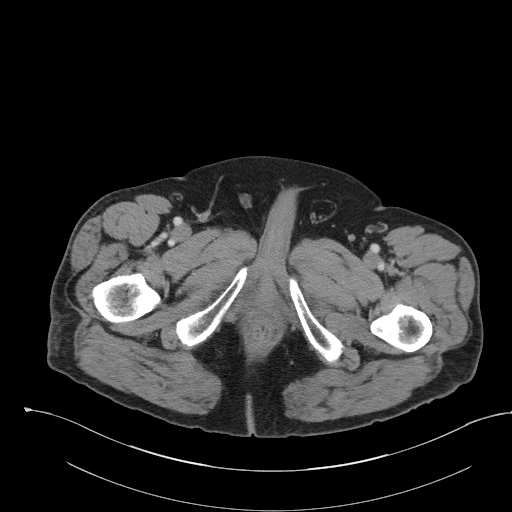
[im 6/100  bone]
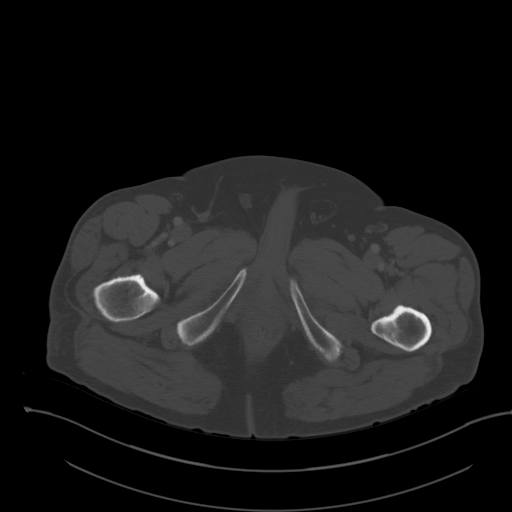
[im 16/100  soft-tissue]
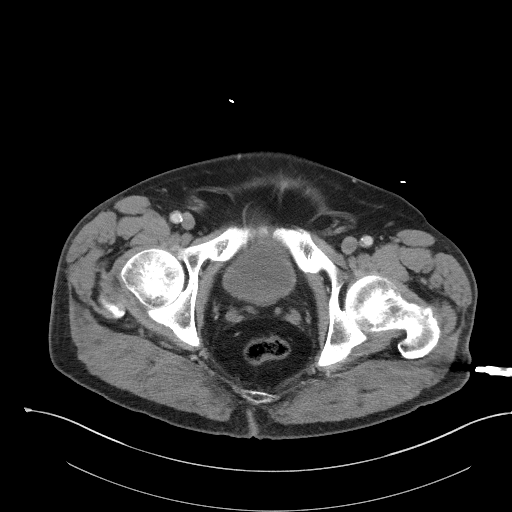
[im 21/100  soft-tissue]
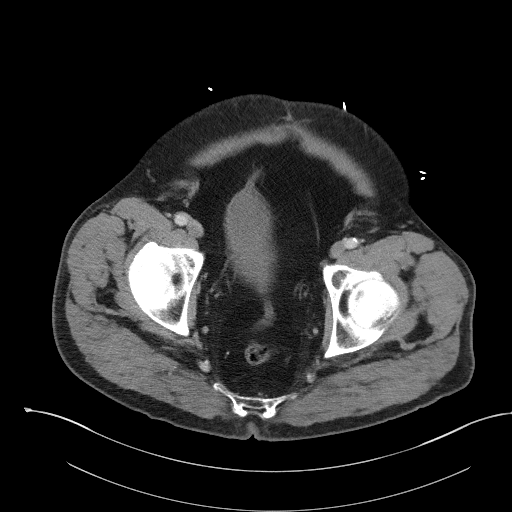
[im 27/100  soft-tissue]
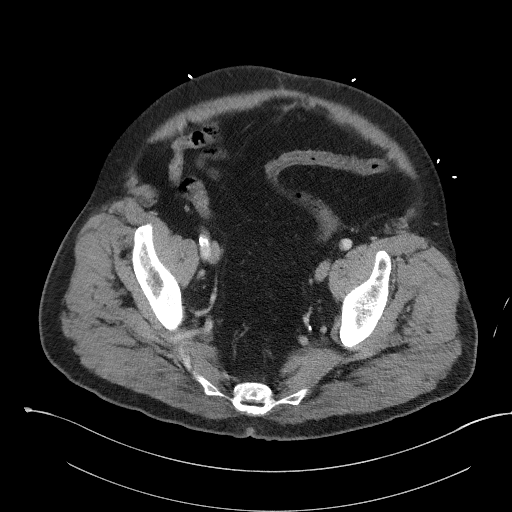
[im 37/100  soft-tissue]
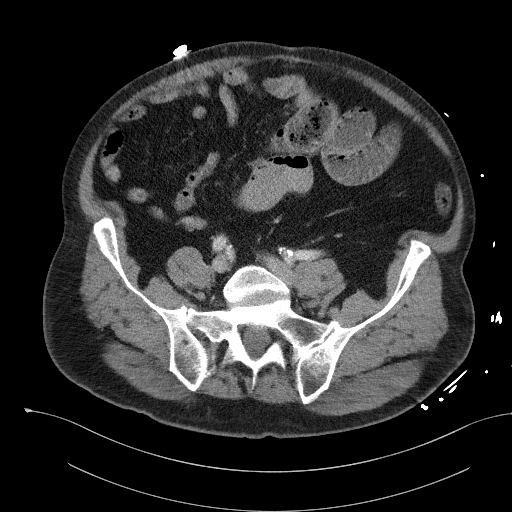
[im 42/100  soft-tissue]
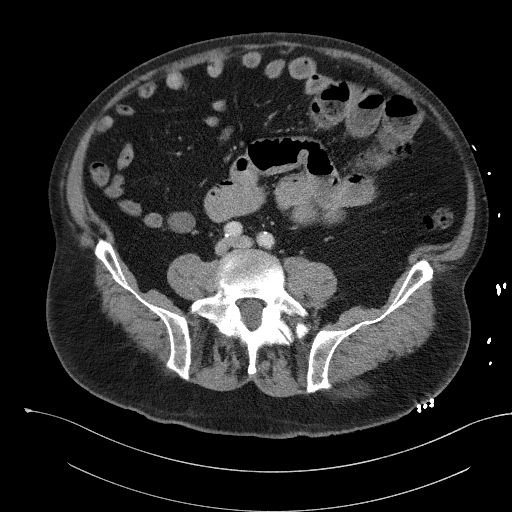
[im 53/100  soft-tissue]
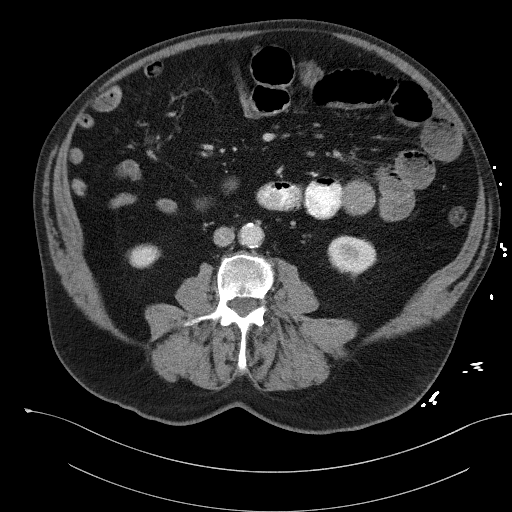
[im 58/100  soft-tissue]
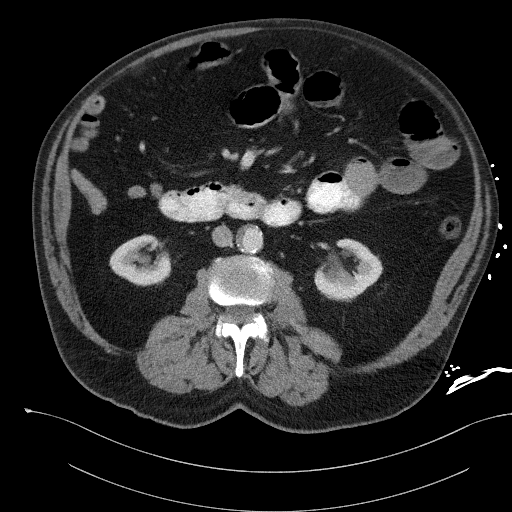
[im 63/100  soft-tissue]
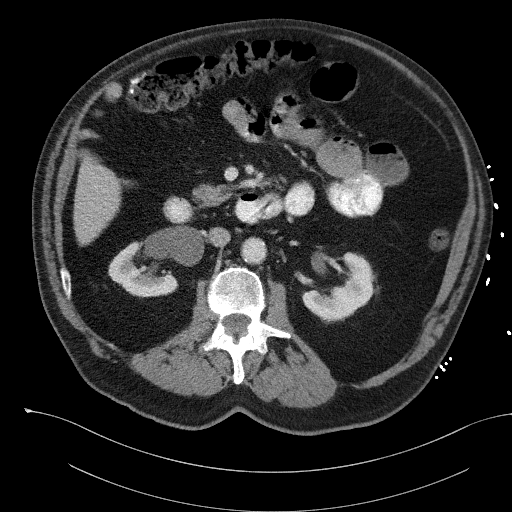
[im 63/100  bone]
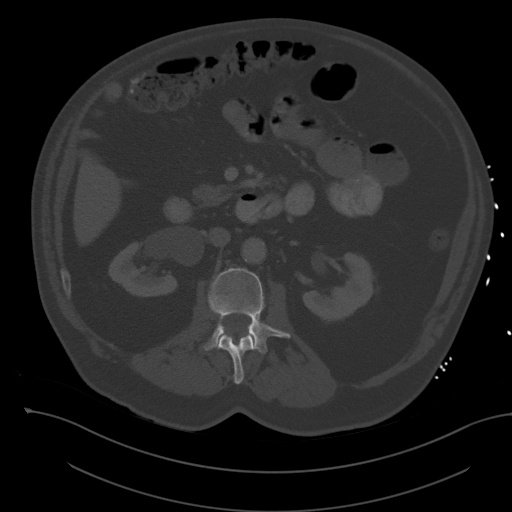
[im 73/100  soft-tissue]
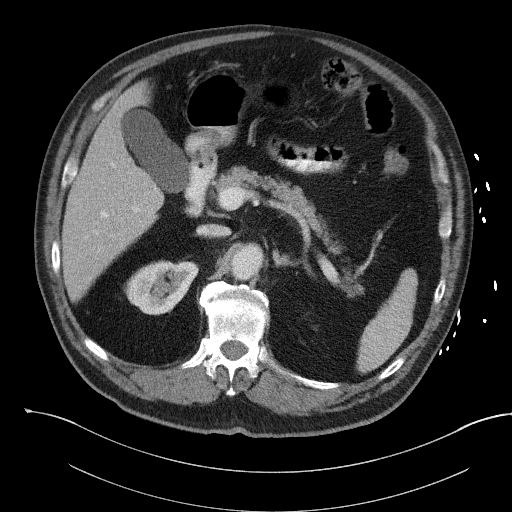
[im 79/100  soft-tissue]
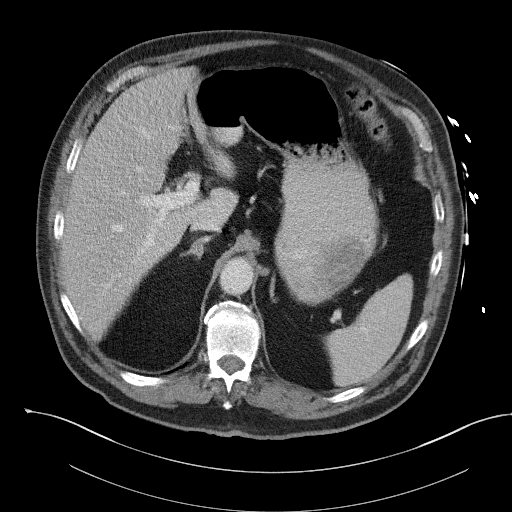
[im 84/100  soft-tissue]
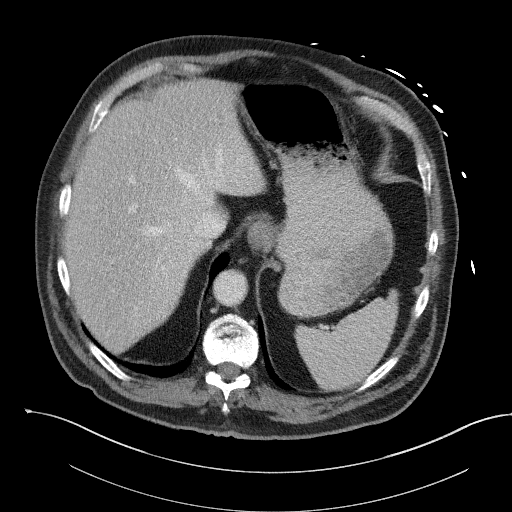
[im 94/100  soft-tissue]
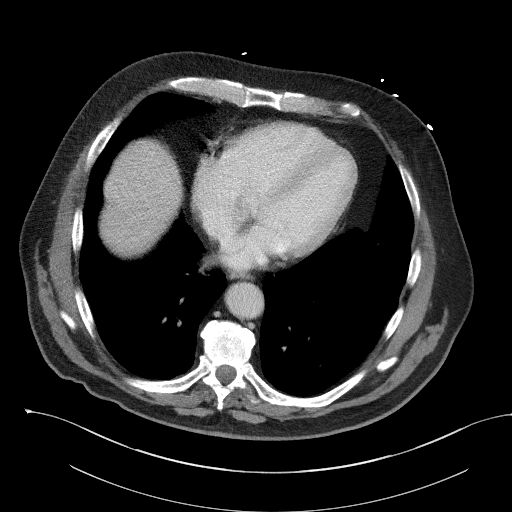

[Series 5: coronal st · coronal · 0.87mm/px · 3 of 129 slices shown]
[im 43/129  soft-tissue]
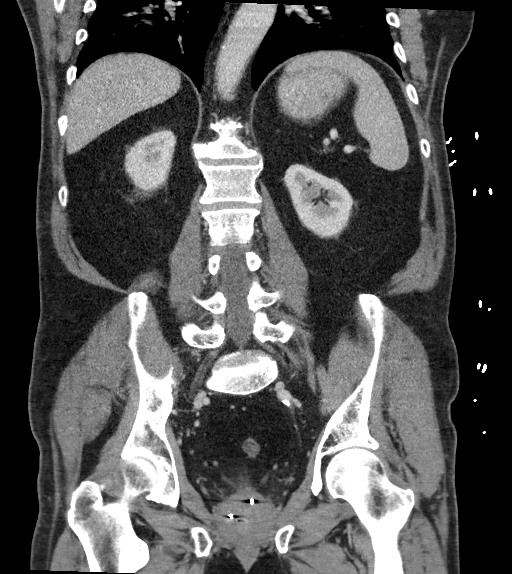
[im 57/129  soft-tissue]
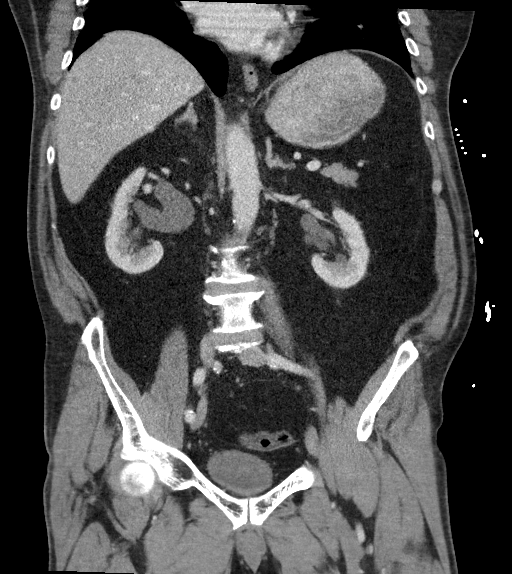
[im 72/129  soft-tissue]
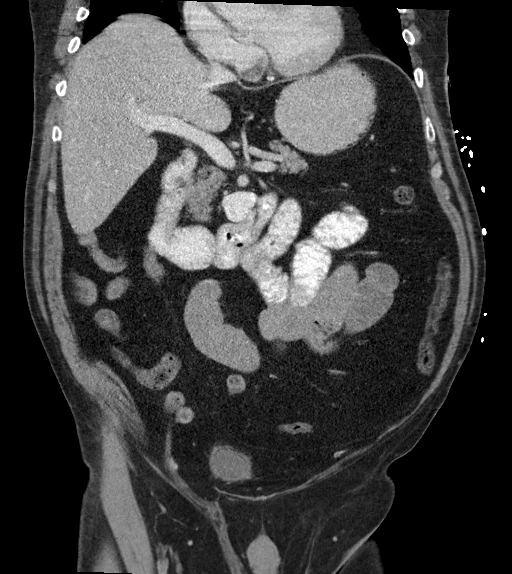

[16 of 46 positions shown; findings below may reference images not displayed]

FINDINGS: Lower chest: Atelectasis in the lung bases.

Hepatobiliary: No focal liver abnormality is seen. No gallstones,
gallbladder wall thickening, or biliary dilatation.

Pancreas: Unremarkable. No pancreatic ductal dilatation or
surrounding inflammatory changes.

Spleen: Normal in size without focal abnormality.

Adrenals/Urinary Tract: No adrenal gland nodules. Kidneys are
symmetrical. Nephrograms are homogeneous. Prominent extrarenal
pelvis on the right, likely represent normal variation. No ureteral
stone or hydroureter. Bladder is unremarkable.

Stomach/Bowel: Stomach is unremarkable. Mildly dilated small bowel
with a few air-fluid levels. Distal bowel is decompressed.
Transition zone is in the anterior mid abdomen at the level of the
umbilicus. There is small bowel feces sign above the transition
zone. Colon is decompressed. There appears to be a previous partial
right hemicolectomy with ileal colonic anastomosis.

Vascular/Lymphatic: Aortic atherosclerosis. No enlarged abdominal or
pelvic lymph nodes.

Reproductive: Prostate gland is enlarged. Metallic foreign bodies
consistent with prostate seed implants or surgical clips.

Other: No free air or free fluid in the abdomen. Prominent visceral
adipose tissues. Small periumbilical hernia containing fat.

Musculoskeletal: Degenerative changes in the spine. No destructive
bone lesions.
IMPRESSION: 1. Low-grade or partial small bowel obstruction with transition zone
in the anterior mid abdomen.
2. Aortic atherosclerosis.

## 2018-03-09 ENCOUNTER — Ambulatory Visit: Payer: Medicare Other | Admitting: Family Medicine

## 2018-03-10 ENCOUNTER — Encounter: Payer: Self-pay | Admitting: Family Medicine

## 2018-03-10 ENCOUNTER — Ambulatory Visit (INDEPENDENT_AMBULATORY_CARE_PROVIDER_SITE_OTHER): Payer: Medicare Other | Admitting: Family Medicine

## 2018-03-10 VITALS — BP 112/88 | Ht 68.0 in | Wt 233.0 lb

## 2018-03-10 DIAGNOSIS — M79673 Pain in unspecified foot: Secondary | ICD-10-CM

## 2018-03-10 DIAGNOSIS — Z23 Encounter for immunization: Secondary | ICD-10-CM

## 2018-03-10 DIAGNOSIS — I10 Essential (primary) hypertension: Secondary | ICD-10-CM | POA: Diagnosis not present

## 2018-03-10 DIAGNOSIS — E785 Hyperlipidemia, unspecified: Secondary | ICD-10-CM | POA: Diagnosis not present

## 2018-03-10 DIAGNOSIS — G40909 Epilepsy, unspecified, not intractable, without status epilepticus: Secondary | ICD-10-CM | POA: Diagnosis not present

## 2018-03-10 DIAGNOSIS — Z79899 Other long term (current) drug therapy: Secondary | ICD-10-CM | POA: Diagnosis not present

## 2018-03-10 NOTE — Progress Notes (Signed)
   Subjective:    Patient ID: Brent Suarez, male    DOB: 01-Aug-1945, 72 y.o.   MRN: [id number]  HPI   Patient is here today to follow up on chronic health issues. He states he eats healthy, he gets some exercise.He does not see any specialist.  Patient continues to take lipid medication regularly. No obvious side effects from it. Generally does not miss a dose. Prior blood work results are reviewed with patient. Patient continues to work on fat intake in diet   Patient has seizure disorder.  Claims compliance with medications.  Does not miss a dose.  No seizures for years.  Known prediabetes.  Patient trying to work on sugar intake.  Patient has prostate cancer.  Followed by specialist.  Patient notes foot pain progressive  Known history of high blood pressure.  Currently not on medications in this regard blood pressure decent when checked elsewhere  Review of Systems No headache, no major weight loss or weight gain, no chest pain no back pain abdominal pain no change in bowel habits complete ROS otherwise negative     Objective:   Physical Exam  Alert and oriented, vitals reviewed and stable, NAD ENT-TM's and ext canals WNL bilat via otoscopic exam Soft palate, tonsils and post pharynx WNL via oropharyngeal exam Neck-symmetric, no masses; thyroid nonpalpable and nontender Pulmonary-no tachypnea or accessory muscle use; Clear without wheezes via auscultation Card--no abnrml murmurs, rhythm reg and rate WNL Carotid pulses symmetric, without bruits       Assessment & Plan:  Impression seizure disorder.  Medications refilled.  Compliance discussed and encouraged  2.  Hypertension.  Good control without meds at this time patient to maintain exercise when possible  3.  Chronic foot pain patient requests podiatry referral we will press on  4.  Prediabetes.  Discussed.  5.  Hyperlipidemia status uncertain  Flu shot today  Follow-up in 6 months wellness plus chronic

## 2018-03-16 ENCOUNTER — Telehealth: Payer: Self-pay | Admitting: Family Medicine

## 2018-03-16 NOTE — Telephone Encounter (Signed)
Pt called back, he prefers a provider in [place]

## 2018-03-16 NOTE — Telephone Encounter (Signed)
[facility] for pt  Need to know if he wants to see Dr. Caprice Beaver [facility] or [place]) or [place]

## 2018-03-17 ENCOUNTER — Encounter: Payer: Self-pay | Admitting: Family Medicine

## 2018-03-30 DIAGNOSIS — M722 Plantar fascial fibromatosis: Secondary | ICD-10-CM | POA: Diagnosis not present

## 2018-03-30 DIAGNOSIS — M79672 Pain in left foot: Secondary | ICD-10-CM | POA: Diagnosis not present

## 2018-04-30 ENCOUNTER — Other Ambulatory Visit: Payer: Self-pay | Admitting: *Deleted

## 2018-04-30 MED ORDER — SIMVASTATIN 20 MG PO TABS: 20.0000 mg | ORAL_TABLET | Freq: Every day | ORAL | 1 refills | Status: DC

## 2018-05-18 DIAGNOSIS — M79672 Pain in left foot: Secondary | ICD-10-CM | POA: Diagnosis not present

## 2018-05-18 DIAGNOSIS — M722 Plantar fascial fibromatosis: Secondary | ICD-10-CM | POA: Diagnosis not present

## 2018-06-15 DIAGNOSIS — E785 Hyperlipidemia, unspecified: Secondary | ICD-10-CM | POA: Diagnosis not present

## 2018-06-15 DIAGNOSIS — Z79899 Other long term (current) drug therapy: Secondary | ICD-10-CM | POA: Diagnosis not present

## 2018-06-16 LAB — HEPATIC FUNCTION PANEL
ALK PHOS: 188 IU/L — AB (ref 39–117)
ALT: 21 IU/L (ref 0–44)
AST: 24 IU/L (ref 0–40)
Albumin: 4.8 g/dL — ABNORMAL HIGH (ref 3.7–4.7)
BILIRUBIN, DIRECT: 0.07 mg/dL (ref 0.00–0.40)
Bilirubin Total: 0.2 mg/dL (ref 0.0–1.2)
Total Protein: 7.5 g/dL (ref 6.0–8.5)

## 2018-06-16 LAB — LIPID PANEL
CHOLESTEROL TOTAL: 146 mg/dL (ref 100–199)
Chol/HDL Ratio: 5 ratio (ref 0.0–5.0)
HDL: 29 mg/dL — ABNORMAL LOW (ref >39)
LDL CALC: 49 mg/dL (ref 0–99)
TRIGLYCERIDES: 340 mg/dL — AB (ref 0–149)
VLDL Cholesterol Cal: 68 mg/dL — ABNORMAL HIGH (ref 5–40)

## 2018-06-21 ENCOUNTER — Encounter: Payer: Self-pay | Admitting: Family Medicine

## 2018-07-01 ENCOUNTER — Other Ambulatory Visit: Payer: Self-pay | Admitting: Family Medicine

## 2018-07-01 NOTE — Telephone Encounter (Signed)
six mo worth

## 2018-09-11 ENCOUNTER — Encounter: Payer: Medicare Other | Admitting: Family Medicine

## 2018-09-18 ENCOUNTER — Other Ambulatory Visit: Payer: Self-pay | Admitting: Family Medicine

## 2018-09-21 NOTE — Telephone Encounter (Signed)
Pt is checking status on refill of medication

## 2018-09-24 ENCOUNTER — Telehealth: Payer: Self-pay | Admitting: Family Medicine

## 2018-09-24 MED ORDER — PHENYTOIN SODIUM EXTENDED 100 MG PO CAPS: ORAL_CAPSULE | ORAL | 5 refills | Status: DC

## 2018-09-24 NOTE — Telephone Encounter (Signed)
Ok six mo worth 

## 2018-09-24 NOTE — Telephone Encounter (Signed)
Patient is requesting refill on phenobarbital 30 mg called into [facility]- [address].He has only three pills left.

## 2018-09-24 NOTE — Telephone Encounter (Signed)
Prescription sent electronically to pharmacy. Left message to return call to notify patient. 

## 2018-09-25 NOTE — Telephone Encounter (Signed)
Patient notified

## 2018-09-26 ENCOUNTER — Other Ambulatory Visit: Payer: Self-pay | Admitting: Family Medicine

## 2018-09-28 NOTE — Telephone Encounter (Signed)
Last seen 02/2018 for htn.

## 2018-09-28 NOTE — Telephone Encounter (Signed)
Six mo visit due, call and sched,then may ref

## 2018-09-30 ENCOUNTER — Other Ambulatory Visit: Payer: Self-pay | Admitting: Family Medicine

## 2018-09-30 NOTE — Telephone Encounter (Signed)
Pt requesting 90 day supply. Can we do 90 day supply? I had sent in 30 day since he made appt. Please advise. Thank you

## 2018-09-30 NOTE — Telephone Encounter (Signed)
Pt has rescheduled CPE 11/10/2018.

## 2018-10-09 ENCOUNTER — Other Ambulatory Visit: Payer: Self-pay | Admitting: Family Medicine

## 2018-11-05 ENCOUNTER — Encounter: Payer: Medicare Other | Admitting: Family Medicine

## 2018-11-10 ENCOUNTER — Ambulatory Visit (INDEPENDENT_AMBULATORY_CARE_PROVIDER_SITE_OTHER): Payer: Medicare Other | Admitting: Family Medicine

## 2018-11-10 ENCOUNTER — Other Ambulatory Visit: Payer: Self-pay

## 2018-11-10 ENCOUNTER — Encounter: Payer: Self-pay | Admitting: Family Medicine

## 2018-11-10 VITALS — BP 132/80 | Temp 97.0°F | Ht 68.0 in | Wt 230.0 lb

## 2018-11-10 DIAGNOSIS — R7301 Impaired fasting glucose: Secondary | ICD-10-CM

## 2018-11-10 DIAGNOSIS — G40909 Epilepsy, unspecified, not intractable, without status epilepticus: Secondary | ICD-10-CM

## 2018-11-10 DIAGNOSIS — R7303 Prediabetes: Secondary | ICD-10-CM

## 2018-11-10 DIAGNOSIS — Z125 Encounter for screening for malignant neoplasm of prostate: Secondary | ICD-10-CM | POA: Diagnosis not present

## 2018-11-10 DIAGNOSIS — E785 Hyperlipidemia, unspecified: Secondary | ICD-10-CM

## 2018-11-10 DIAGNOSIS — Z79899 Other long term (current) drug therapy: Secondary | ICD-10-CM

## 2018-11-10 DIAGNOSIS — Z0000 Encounter for general adult medical examination without abnormal findings: Secondary | ICD-10-CM

## 2018-11-10 DIAGNOSIS — I10 Essential (primary) hypertension: Secondary | ICD-10-CM | POA: Diagnosis not present

## 2018-11-10 DIAGNOSIS — C61 Malignant neoplasm of prostate: Secondary | ICD-10-CM | POA: Diagnosis not present

## 2018-11-10 MED ORDER — PHENOBARBITAL 30 MG PO TABS: ORAL_TABLET | ORAL | 1 refills | Status: DC

## 2018-11-10 MED ORDER — SIMVASTATIN 20 MG PO TABS: ORAL_TABLET | ORAL | 1 refills | Status: DC

## 2018-11-10 MED ORDER — PHENYTOIN SODIUM EXTENDED 100 MG PO CAPS: ORAL_CAPSULE | ORAL | 5 refills | Status: DC

## 2018-11-10 MED ORDER — TAMSULOSIN HCL 0.4 MG PO CAPS: ORAL_CAPSULE | ORAL | 1 refills | Status: DC

## 2018-11-10 NOTE — Progress Notes (Signed)
Subjective:    Patient ID: Brent Suarez, male    DOB: Feb 01, 1946, 73 y.o.   MRN: [id number]  HPI The patient comes in today for a wellness visit.    A review of their health history was completed.  A review of medications was also completed.  Any needed refills; update all meds  Eating habits: health conscious  Falls/  MVA accidents in past few months: none  Regular exercise: try to. 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   Additional concerns: none Results for orders placed or performed in visit on 03/10/18  Lipid panel  Result Value Ref Range   Cholesterol, Total 146 100 - 199 mg/dL   Triglycerides 340 (H) 0 - 149 mg/dL   HDL 29 (L) >39 mg/dL   VLDL Cholesterol Cal 68 (H) 5 - 40 mg/dL   LDL Calculated 49 0 - 99 mg/dL   Chol/HDL Ratio 5.0 0.0 - 5.0 ratio  Hepatic function panel  Result Value Ref Range   Total Protein 7.5 6.0 - 8.5 g/dL   Albumin 4.8 (H) 3.7 - 4.7 g/dL   Bilirubin Total 0.2 0.0 - 1.2 mg/dL   Bilirubin, Direct 0.07 0.00 - 0.40 mg/dL   Alkaline Phosphatase 188 (H) 39 - 117 IU/L   AST 24 0 - 40 IU/L   ALT 21 0 - 44 IU/L    Patient continues to take lipid medication regularly. No obvious side effects from it. Generally does not miss a dose. Prior blood work results are reviewed with patient. Patient continues to work on fat intake in diet  Seizure meds compliant.  No obvious side effects from medication.  Compliant with it.  Known prediabetes.  Mostly watching diet.  Trying to watch sugar intake  History of prostate cancer.  Has not seen his urologist recently.  Had radiation seeding approach.  PSA last done 1 year ago   Review of Systems  Constitutional: Negative for activity change, appetite change and fever.  HENT: Negative for congestion and rhinorrhea.   Eyes: Negative for discharge.  Respiratory: Negative for cough and wheezing.   Cardiovascular: Negative for chest pain.   Gastrointestinal: Negative for abdominal pain, blood in stool and vomiting.  Genitourinary: Negative for difficulty urinating and frequency.  Musculoskeletal: Negative for neck pain.  Skin: Negative for rash.  Allergic/Immunologic: Negative for environmental allergies and food allergies.  Neurological: Negative for weakness and headaches.  Psychiatric/Behavioral: Negative for agitation.  All other systems reviewed and are negative.      Objective:   Physical Exam Vitals signs reviewed.  Constitutional:      Appearance: He is well-developed.  HENT:     Head: Normocephalic and atraumatic.     Right Ear: External ear normal.     Left Ear: External ear normal.     Nose: Nose normal.  Eyes:     Pupils: Pupils are equal, round, and reactive to light.  Neck:     Musculoskeletal: Normal range of motion and neck supple.     Thyroid: No thyromegaly.  Cardiovascular:     Rate and Rhythm: Normal rate and regular rhythm.     Heart sounds: Normal heart sounds. No murmur.  Pulmonary:     Effort: Pulmonary effort is normal. No respiratory distress.     Breath sounds: Normal breath sounds. No wheezing.  Abdominal:     General: Bowel sounds are normal. There is no distension.  Palpations: Abdomen is soft. There is no mass.     Tenderness: There is no abdominal tenderness.  Genitourinary:    Penis: Normal.   Musculoskeletal: Normal range of motion.  Lymphadenopathy:     Cervical: No cervical adenopathy.  Skin:    General: Skin is warm and dry.     Findings: No erythema.  Neurological:     Mental Status: He is alert.     Motor: No abnormal muscle tone.  Psychiatric:        Behavior: Behavior normal.        Judgment: Judgment normal.           Assessment & Plan:  Impression #1 wellness exam.  You td on colonoscoy next yr due.  Compliant with medications.  Diet discussed.  Exercise discussed.  Appropriate blood work ordered.  Get flu shot this fall discussed and encouraged   2.  Seizure disorder.  Clinically stable on chronic meds.  Has maintained long-term and handling well will maintain  3.  Hyperlipidemia.  Prior blood work reviewed.  Compliance discussed medications refilled  4.  Prostate cancer.  Has not seen urologist for a year.  Has not had PSA drawn we will do PSA encouraged to see urologist.  Follow-up in 6 months

## 2018-12-08 DIAGNOSIS — D225 Melanocytic nevi of trunk: Secondary | ICD-10-CM | POA: Diagnosis not present

## 2018-12-08 DIAGNOSIS — Z0000 Encounter for general adult medical examination without abnormal findings: Secondary | ICD-10-CM | POA: Diagnosis not present

## 2018-12-08 DIAGNOSIS — L820 Inflamed seborrheic keratosis: Secondary | ICD-10-CM | POA: Diagnosis not present

## 2018-12-08 DIAGNOSIS — I10 Essential (primary) hypertension: Secondary | ICD-10-CM | POA: Diagnosis not present

## 2018-12-08 DIAGNOSIS — E785 Hyperlipidemia, unspecified: Secondary | ICD-10-CM | POA: Diagnosis not present

## 2018-12-08 DIAGNOSIS — Z125 Encounter for screening for malignant neoplasm of prostate: Secondary | ICD-10-CM | POA: Diagnosis not present

## 2018-12-08 DIAGNOSIS — Z79899 Other long term (current) drug therapy: Secondary | ICD-10-CM | POA: Diagnosis not present

## 2018-12-09 LAB — BASIC METABOLIC PANEL
BUN/Creatinine Ratio: 16 (ref 10–24)
BUN: 12 mg/dL (ref 8–27)
CO2: 23 mmol/L (ref 20–29)
Calcium: 8.8 mg/dL (ref 8.6–10.2)
Chloride: 103 mmol/L (ref 96–106)
Creatinine, Ser: 0.76 mg/dL (ref 0.76–1.27)
GFR calc Af Amer: 106 mL/min/{1.73_m2} (ref >59)
GFR calc non Af Amer: 92 mL/min/{1.73_m2} (ref >59)
Glucose: 108 mg/dL — ABNORMAL HIGH (ref 65–99)
Potassium: 5.1 mmol/L (ref 3.5–5.2)
Sodium: 141 mmol/L (ref 134–144)

## 2018-12-09 LAB — LIPID PANEL
Chol/HDL Ratio: 6.8 ratio — ABNORMAL HIGH (ref 0.0–5.0)
Cholesterol, Total: 156 mg/dL (ref 100–199)
HDL: 23 mg/dL — ABNORMAL LOW (ref >39)
Triglycerides: 545 mg/dL — ABNORMAL HIGH (ref 0–149)

## 2018-12-09 LAB — HEPATIC FUNCTION PANEL
ALT: 17 IU/L (ref 0–44)
AST: 19 IU/L (ref 0–40)
Albumin: 4.3 g/dL (ref 3.7–4.7)
Alkaline Phosphatase: 175 IU/L — ABNORMAL HIGH (ref 39–117)
Bilirubin Total: <0.2 mg/dL (ref 0.0–1.2)
Bilirubin, Direct: 0.07 mg/dL (ref 0.00–0.40)
Total Protein: 7.4 g/dL (ref 6.0–8.5)

## 2018-12-09 LAB — PSA, TOTAL AND FREE
PSA, Free Pct: 30 %
PSA, Free: 0.09 ng/mL
Prostate Specific Ag, Serum: 0.3 ng/mL (ref 0.0–4.0)

## 2018-12-12 ENCOUNTER — Encounter: Payer: Self-pay | Admitting: Family Medicine

## 2018-12-28 ENCOUNTER — Other Ambulatory Visit: Payer: Self-pay | Admitting: Family Medicine

## 2018-12-28 NOTE — Telephone Encounter (Signed)
6 mo worth  

## 2019-03-12 ENCOUNTER — Other Ambulatory Visit (INDEPENDENT_AMBULATORY_CARE_PROVIDER_SITE_OTHER): Payer: Medicare Other

## 2019-03-12 DIAGNOSIS — Z23 Encounter for immunization: Secondary | ICD-10-CM | POA: Diagnosis not present

## 2019-03-30 ENCOUNTER — Other Ambulatory Visit: Payer: Self-pay | Admitting: Family Medicine

## 2019-04-02 ENCOUNTER — Other Ambulatory Visit: Payer: Self-pay

## 2019-04-02 ENCOUNTER — Telehealth: Payer: Self-pay | Admitting: Family Medicine

## 2019-04-02 MED ORDER — PHENYTOIN SODIUM EXTENDED 100 MG PO CAPS: ORAL_CAPSULE | ORAL | 2 refills | Status: DC

## 2019-04-02 NOTE — Telephone Encounter (Signed)
Prescription sent electronically to pharmacy. Patient notified. 

## 2019-04-02 NOTE — Telephone Encounter (Signed)
May do refill x2

## 2019-04-02 NOTE — Telephone Encounter (Signed)
Pt's wife calling in requesting refill on phenytoin (DILANTIN) 100 MG ER capsule. She states her son must have accidentally thrown it away. They have looked all through the house and car and trash and can not find it.   They would like to know if a refill can be sent to [facility] in [place]

## 2019-05-12 ENCOUNTER — Other Ambulatory Visit: Payer: Self-pay

## 2019-05-12 ENCOUNTER — Encounter: Payer: Self-pay | Admitting: Family Medicine

## 2019-05-12 ENCOUNTER — Ambulatory Visit (INDEPENDENT_AMBULATORY_CARE_PROVIDER_SITE_OTHER): Payer: Medicare Other | Admitting: Family Medicine

## 2019-05-12 ENCOUNTER — Other Ambulatory Visit: Payer: Self-pay | Admitting: Family Medicine

## 2019-05-12 VITALS — BP 159/110

## 2019-05-12 DIAGNOSIS — G40909 Epilepsy, unspecified, not intractable, without status epilepticus: Secondary | ICD-10-CM

## 2019-05-12 DIAGNOSIS — E785 Hyperlipidemia, unspecified: Secondary | ICD-10-CM

## 2019-05-12 DIAGNOSIS — R7303 Prediabetes: Secondary | ICD-10-CM

## 2019-05-12 MED ORDER — PHENOBARBITAL 30 MG PO TABS: ORAL_TABLET | ORAL | 1 refills | Status: DC

## 2019-05-12 MED ORDER — PHENYTOIN SODIUM EXTENDED 100 MG PO CAPS: ORAL_CAPSULE | ORAL | 1 refills | Status: DC

## 2019-05-12 MED ORDER — SIMVASTATIN 20 MG PO TABS: ORAL_TABLET | ORAL | 1 refills | Status: DC

## 2019-05-12 MED ORDER — TAMSULOSIN HCL 0.4 MG PO CAPS: ORAL_CAPSULE | ORAL | 1 refills | Status: DC

## 2019-05-12 NOTE — Telephone Encounter (Signed)
Has visit today

## 2019-05-12 NOTE — Progress Notes (Signed)
   Subjective:    Patient ID: Brent Suarez, male    DOB: [birth year], 73 y.o.   MRN: [id number]   Pt states he is doing well, no issues or concerns. Pt check blood pressure regular.  Virtual Visit via Telephone Note  I connected with Brent Suarez on 05/12/19 at 10:30 AM EST by telephone and verified that I am speaking with the correct person using two identifiers.  Location: Patient: home Provider: office   I discussed the limitations, risks, security and privacy concerns of performing an evaluation and management service by telephone and the availability of in person appointments. I also discussed with the patient that there may be a patient responsible charge related to this service. The patient expressed understanding and agreed to proceed.   History of Present Illness:    Observations/Objective:   Assessment and Plan:   Follow Up Instructions:    I discussed the assessment and treatment plan with the patient. The patient was provided an opportunity to ask questions and all were answered. The patient agreed with the plan and demonstrated an understanding of the instructions.   The patient was advised to call back or seek an in-person evaluation if the symptoms worsen or if the condition fails to improve as anticipated.  I provided 18 minutes of non-face-to-face time during this encounter.   Vicente Males, LPN   Patient continues to take lipid medication regularly. No obvious side effects from it. Generally does not miss a dose. Prior blood work results are reviewed with patient. Patient continues to work on fat intake in diet  Compliant with his seizure medication.  Has not missed a dose.  No seizure symptoms  Continues to use good precautions from COVID-19   Review of Systems No headache no chest pain no shortness of breath    Objective:   Physical Exam  Virtual      Assessment & Plan:  Impression 1 hyperlipidemia.  Excellent control 6 months ago.  Diet  discussed exercise discussed.  Medications refilled.  We will hold off on blood work at this time  2.  Seizure disorder clinically stable to maintain same meds medications refilled  Follow-up in 6 months

## 2019-06-17 ENCOUNTER — Encounter: Payer: Self-pay | Admitting: Family Medicine

## 2019-06-23 ENCOUNTER — Other Ambulatory Visit: Payer: Self-pay | Admitting: Family Medicine

## 2019-06-29 DIAGNOSIS — Z23 Encounter for immunization: Secondary | ICD-10-CM | POA: Diagnosis not present

## 2019-07-27 DIAGNOSIS — Z23 Encounter for immunization: Secondary | ICD-10-CM | POA: Diagnosis not present

## 2019-09-14 ENCOUNTER — Telehealth: Payer: Self-pay | Admitting: Family Medicine

## 2019-09-14 MED ORDER — SIMVASTATIN 20 MG PO TABS: ORAL_TABLET | ORAL | 0 refills | Status: DC

## 2019-09-14 NOTE — Telephone Encounter (Signed)
Switching pharmacy and needs new rx for simvastatin (ZOCOR) 20 MG tablet    Sent to [facility] in [place]

## 2019-10-06 ENCOUNTER — Telehealth: Payer: Self-pay | Admitting: *Deleted

## 2019-10-06 DIAGNOSIS — Z125 Encounter for screening for malignant neoplasm of prostate: Secondary | ICD-10-CM

## 2019-10-06 DIAGNOSIS — E785 Hyperlipidemia, unspecified: Secondary | ICD-10-CM

## 2019-10-06 DIAGNOSIS — Z79899 Other long term (current) drug therapy: Secondary | ICD-10-CM

## 2019-10-06 DIAGNOSIS — R7303 Prediabetes: Secondary | ICD-10-CM

## 2019-10-06 DIAGNOSIS — I10 Essential (primary) hypertension: Secondary | ICD-10-CM

## 2019-10-06 NOTE — Telephone Encounter (Signed)
Patient scheduled for hyperlipidemia 10/13/19 Last labs 12/08/2018: Lipid, Liver, Met 7 and PSA

## 2019-10-06 NOTE — Telephone Encounter (Signed)
Yes, pls order and call pt for fasting labs 2-3 days prior to appt.  Thx.   Dr. Lovena Le

## 2019-10-06 NOTE — Telephone Encounter (Signed)
Blood work ordered in Epic. Left message to return call to notify patient. 

## 2019-10-07 NOTE — Telephone Encounter (Signed)
Pt.notified

## 2019-10-12 DIAGNOSIS — Z79899 Other long term (current) drug therapy: Secondary | ICD-10-CM | POA: Diagnosis not present

## 2019-10-12 DIAGNOSIS — R7303 Prediabetes: Secondary | ICD-10-CM | POA: Diagnosis not present

## 2019-10-12 DIAGNOSIS — E785 Hyperlipidemia, unspecified: Secondary | ICD-10-CM | POA: Diagnosis not present

## 2019-10-12 DIAGNOSIS — I10 Essential (primary) hypertension: Secondary | ICD-10-CM | POA: Diagnosis not present

## 2019-10-12 DIAGNOSIS — Z125 Encounter for screening for malignant neoplasm of prostate: Secondary | ICD-10-CM | POA: Diagnosis not present

## 2019-10-13 ENCOUNTER — Ambulatory Visit (INDEPENDENT_AMBULATORY_CARE_PROVIDER_SITE_OTHER): Payer: Medicare Other | Admitting: Family Medicine

## 2019-10-13 ENCOUNTER — Encounter: Payer: Self-pay | Admitting: Family Medicine

## 2019-10-13 ENCOUNTER — Other Ambulatory Visit: Payer: Self-pay

## 2019-10-13 VITALS — BP 138/84 | HR 73 | Temp 97.0°F | Ht 68.0 in | Wt 227.0 lb

## 2019-10-13 DIAGNOSIS — R7303 Prediabetes: Secondary | ICD-10-CM | POA: Diagnosis not present

## 2019-10-13 DIAGNOSIS — E785 Hyperlipidemia, unspecified: Secondary | ICD-10-CM

## 2019-10-13 DIAGNOSIS — I10 Essential (primary) hypertension: Secondary | ICD-10-CM

## 2019-10-13 DIAGNOSIS — I8393 Asymptomatic varicose veins of bilateral lower extremities: Secondary | ICD-10-CM

## 2019-10-13 DIAGNOSIS — R7301 Impaired fasting glucose: Secondary | ICD-10-CM

## 2019-10-13 DIAGNOSIS — G40909 Epilepsy, unspecified, not intractable, without status epilepticus: Secondary | ICD-10-CM | POA: Diagnosis not present

## 2019-10-13 LAB — CBC
Hematocrit: 41.6 % (ref 37.5–51.0)
Hemoglobin: 14.4 g/dL (ref 13.0–17.7)
MCH: 29.6 pg (ref 26.6–33.0)
MCHC: 34.6 g/dL (ref 31.5–35.7)
MCV: 86 fL (ref 79–97)
Platelets: 191 10*3/uL (ref 150–450)
RBC: 4.86 x10E6/uL (ref 4.14–5.80)
RDW: 13.2 % (ref 11.6–15.4)
WBC: 4.5 10*3/uL (ref 3.4–10.8)

## 2019-10-13 LAB — BASIC METABOLIC PANEL
BUN/Creatinine Ratio: 15 (ref 10–24)
BUN: 11 mg/dL (ref 8–27)
CO2: 24 mmol/L (ref 20–29)
Calcium: 9.5 mg/dL (ref 8.6–10.2)
Chloride: 102 mmol/L (ref 96–106)
Creatinine, Ser: 0.71 mg/dL — ABNORMAL LOW (ref 0.76–1.27)
GFR calc Af Amer: 108 mL/min/{1.73_m2} (ref >59)
GFR calc non Af Amer: 94 mL/min/{1.73_m2} (ref >59)
Glucose: 105 mg/dL — ABNORMAL HIGH (ref 65–99)
Potassium: 4.7 mmol/L (ref 3.5–5.2)
Sodium: 141 mmol/L (ref 134–144)

## 2019-10-13 LAB — LIPID PANEL
Chol/HDL Ratio: 4.9 ratio (ref 0.0–5.0)
Cholesterol, Total: 137 mg/dL (ref 100–199)
HDL: 28 mg/dL — ABNORMAL LOW (ref >39)
LDL Chol Calc (NIH): 66 mg/dL (ref 0–99)
Triglycerides: 268 mg/dL — ABNORMAL HIGH (ref 0–149)
VLDL Cholesterol Cal: 43 mg/dL — ABNORMAL HIGH (ref 5–40)

## 2019-10-13 LAB — HEPATIC FUNCTION PANEL
ALT: 17 IU/L (ref 0–44)
AST: 21 IU/L (ref 0–40)
Albumin: 4.5 g/dL (ref 3.7–4.7)
Alkaline Phosphatase: 210 IU/L — ABNORMAL HIGH (ref 48–121)
Bilirubin Total: <0.2 mg/dL (ref 0.0–1.2)
Bilirubin, Direct: 0.08 mg/dL (ref 0.00–0.40)
Total Protein: 7.7 g/dL (ref 6.0–8.5)

## 2019-10-13 LAB — PSA: Prostate Specific Ag, Serum: 0.2 ng/mL (ref 0.0–4.0)

## 2019-10-13 NOTE — Progress Notes (Signed)
Patient ID: Brent Suarez, male    DOB: 18-Sep-1945, 74 y.o.   MRN: [id number]   Chief Complaint  Patient presents with  . Follow-up    Pt here today for 6 month follow up. Pt doing well, no issues or concerns.   . Hyperlipidemia  . Hypertension   Subjective:    HPI Seen for f/u on HLD. H/o varicose veins in b/l LEs-     Used to see vein clinic in [place].      Has some veins rt lower leg.     Has had vein ablation in past.     No pain in leg now.  HLD- doing well with medication.  H/o seizure disorder- taking phenobarbital.  Doing well on this level.  Prostate cancer- had radiation. Seeing specialist. Seeing 1x per year now.  HTN- pt is not on medication.  Not having chest pain, sob, headaches, or leg swelling.  Medical History Brent Suarez has a past medical history of Epilepsy ([place]), Glucose intolerance (impaired glucose tolerance), Hyperlipidemia, Hypertension, Prostate cancer ([place]) (10/10/11), SBO (small bowel obstruction) ([place]) (10/19/210), Seizures ([place]), and UTI (lower urinary tract infection).   Outpatient Encounter Medications as of 10/13/2019  Medication Sig  . aspirin 81 MG tablet Take 81 mg by mouth every evening.   . cholecalciferol (VITAMIN D) 1000 units tablet Take 1 tablet (1,000 Units total) by mouth at bedtime.  . Omega-3 Fatty Acids (FISH OIL) 1000 MG CAPS Take 1 capsule by mouth at bedtime.  Marland Kitchen PHENObarbital (LUMINAL) 30 MG tablet TAKE 3 TABLETS AT BEDTIME  . phenytoin (DILANTIN) 100 MG ER capsule TAKE 3 CAPSULES(300 MG) BY MOUTH AT BEDTIME  . polyethylene glycol powder (GLYCOLAX/MIRALAX) powder Take 17 g by mouth 2 (two) times daily.  . simvastatin (ZOCOR) 20 MG tablet TAKE 1 TABLET(20 MG) BY MOUTH DAILY  . tamsulosin (FLOMAX) 0.4 MG CAPS capsule TAKE 1 CAPSULE BY MOUTH EVERY EVENING WITH FOOD  . tamsulosin (FLOMAX) 0.4 MG CAPS capsule TAKE 1 CAPSULE BY MOUTH EVERY EVENING WITH FOOD  . vitamin C (ASCORBIC ACID) 500 MG tablet Take 500 mg by mouth at bedtime.    No facility-administered encounter medications on file as of 10/13/2019.     Review of Systems  Constitutional: Negative for chills and fever.  HENT: Negative for congestion, rhinorrhea and sore throat.   Respiratory: Negative for cough, shortness of breath and wheezing.   Cardiovascular: Negative for chest pain and leg swelling.  Gastrointestinal: Negative for abdominal pain, diarrhea, nausea and vomiting.  Genitourinary: Negative for dysuria and frequency.  Skin: Negative for rash.  Neurological: Negative for dizziness, weakness and headaches.     Vitals BP 138/84   Pulse 73   Temp (!) 97 F (36.1 C)   Ht 5\' 8"  (1.727 m)   Wt 227 lb (103 kg)   BMI 34.52 kg/m   Objective:   Physical Exam Constitutional:      General: He is not in acute distress.    Appearance: Normal appearance. He is not ill-appearing.  HENT:     Head: Normocephalic.     Nose: Nose normal. No congestion.     Mouth/Throat:     Mouth: Mucous membranes are moist.     Pharynx: No oropharyngeal exudate.  Eyes:     Extraocular Movements: Extraocular movements intact.     Conjunctiva/sclera: Conjunctivae normal.     Pupils: Pupils are equal, round, and reactive to light.  Cardiovascular:     Rate and Rhythm: Normal rate  and regular rhythm.     Pulses: Normal pulses.     Heart sounds: Normal heart sounds. No murmur.  Pulmonary:     Effort: Pulmonary effort is normal.     Breath sounds: Normal breath sounds. No wheezing, rhonchi or rales.  Musculoskeletal:        General: Normal range of motion.     Right lower leg: No edema.     Left lower leg: No edema.  Skin:    General: Skin is warm and dry.     Findings: No bruising, erythema, lesion or rash.     Comments: +bilateral LE varicose veins.  No bruising or ecchymosis, no tenderness over veins.    Neurological:     General: No focal deficit present.     Mental Status: He is alert and oriented to person, place, and time.     Cranial Nerves: No  cranial nerve deficit.     Sensory: No sensory deficit.     Motor: No weakness.  Psychiatric:        Mood and Affect: Mood normal.        Behavior: Behavior normal.     Assessment and Plan   1. Hyperlipidemia LDL goal <130  2. Seizure disorder (HCC) - Phenobarbital level; Future - Dilantin (Phenytoin) level, total  3. Essential hypertension  4. Impaired fasting glucose  5. Prediabetes  6. Varicose veins of both lower extremities without ulcer or inflammation    HTN- slightly elevated on this visit.  Will cont to monitor and decrease salt intake. HLD- stable, cont to monitor and cont fish oil. elevated TG, but has improved.  LDL- normal range. Labs reviewed.  Prediabetes- Glucose- slightly elevated. Cont to watch carb intake. PSA- in normal range. H/o prostate cancer- cont f/u with Urology. seizure disorder- stable.  No break thorough seizures.  Cont meds. And check dilantin and phenobarbital level. Varicose veins- pt to f/u with vein center prn.  F/u 76mo or prn.

## 2019-12-03 ENCOUNTER — Other Ambulatory Visit: Payer: Self-pay | Admitting: *Deleted

## 2019-12-04 MED ORDER — TAMSULOSIN HCL 0.4 MG PO CAPS: ORAL_CAPSULE | ORAL | 1 refills | Status: DC

## 2019-12-27 ENCOUNTER — Telehealth: Payer: Self-pay | Admitting: *Deleted

## 2019-12-27 DIAGNOSIS — G40909 Epilepsy, unspecified, not intractable, without status epilepticus: Secondary | ICD-10-CM

## 2019-12-27 NOTE — Telephone Encounter (Signed)
Pls have pt go for dilantin level. They should be in computer.  When can he go?  Thx.   Dr. Lovena Le

## 2019-12-28 ENCOUNTER — Other Ambulatory Visit: Payer: Self-pay | Admitting: *Deleted

## 2019-12-28 ENCOUNTER — Other Ambulatory Visit: Payer: Self-pay | Admitting: Family Medicine

## 2019-12-28 DIAGNOSIS — Z79899 Other long term (current) drug therapy: Secondary | ICD-10-CM

## 2019-12-28 MED ORDER — PHENYTOIN SODIUM EXTENDED 100 MG PO CAPS: ORAL_CAPSULE | ORAL | 0 refills | Status: DC

## 2019-12-28 NOTE — Progress Notes (Signed)
dila

## 2019-12-28 NOTE — Addendum Note (Signed)
Addended by: Dairl Ponder on: 12/28/2019 05:08 PM   Modules accepted: Orders

## 2019-12-28 NOTE — Telephone Encounter (Signed)
Left message to return call 

## 2019-12-29 NOTE — Telephone Encounter (Signed)
Patient stated he will try to get the labs work completed today or tomorrow.

## 2019-12-31 DIAGNOSIS — G40909 Epilepsy, unspecified, not intractable, without status epilepticus: Secondary | ICD-10-CM | POA: Diagnosis not present

## 2020-01-01 LAB — PHENOBARBITAL LEVEL: Phenobarbital, Serum: 15 ug/mL (ref 15–40)

## 2020-01-03 MED ORDER — PHENYTOIN SODIUM EXTENDED 100 MG PO CAPS: ORAL_CAPSULE | ORAL | 5 refills | Status: DC

## 2020-01-03 MED ORDER — PHENOBARBITAL 30 MG PO TABS: ORAL_TABLET | ORAL | 1 refills | Status: DC

## 2020-01-03 NOTE — Addendum Note (Signed)
Addended by: Erven Colla on: 01/03/2020 10:30 AM   Modules accepted: Orders

## 2020-02-17 ENCOUNTER — Other Ambulatory Visit (INDEPENDENT_AMBULATORY_CARE_PROVIDER_SITE_OTHER): Payer: Medicare Other | Admitting: *Deleted

## 2020-02-17 DIAGNOSIS — Z23 Encounter for immunization: Secondary | ICD-10-CM

## 2020-02-29 ENCOUNTER — Encounter: Payer: Self-pay | Admitting: Internal Medicine

## 2020-04-10 DIAGNOSIS — Z23 Encounter for immunization: Secondary | ICD-10-CM | POA: Diagnosis not present

## 2020-05-30 ENCOUNTER — Other Ambulatory Visit: Payer: Self-pay | Admitting: Family Medicine

## 2020-05-30 ENCOUNTER — Other Ambulatory Visit: Payer: Self-pay | Admitting: *Deleted

## 2020-05-30 MED ORDER — SIMVASTATIN 20 MG PO TABS: ORAL_TABLET | ORAL | 0 refills | Status: DC

## 2020-05-30 NOTE — Telephone Encounter (Signed)
Pt needs f/u appt on labs/meds.  In the next 45-60 days. Thx. Dr. Lovena Le

## 2020-05-31 NOTE — Telephone Encounter (Signed)
Please contact patient to have him set up appt. Thank you! 

## 2020-06-09 NOTE — Telephone Encounter (Signed)
LM to schedule Med Check

## 2020-06-22 ENCOUNTER — Telehealth: Payer: Self-pay

## 2020-06-22 DIAGNOSIS — E785 Hyperlipidemia, unspecified: Secondary | ICD-10-CM

## 2020-06-22 DIAGNOSIS — Z79899 Other long term (current) drug therapy: Secondary | ICD-10-CM

## 2020-06-22 DIAGNOSIS — C61 Malignant neoplasm of prostate: Secondary | ICD-10-CM

## 2020-06-22 DIAGNOSIS — I10 Essential (primary) hypertension: Secondary | ICD-10-CM

## 2020-06-22 NOTE — Telephone Encounter (Signed)
Last labs June 2021. Lipid, liver, cmp, cbc, psa, dilantin

## 2020-06-22 NOTE — Telephone Encounter (Signed)
Med Check appt schedule for 02/15 wants to know if he needs blood work  Pt call back [phone]

## 2020-06-22 NOTE — Telephone Encounter (Signed)
Appointment  On 2/15

## 2020-06-22 NOTE — Telephone Encounter (Signed)
Orders put in and left message to return call to notify pt.

## 2020-06-22 NOTE — Telephone Encounter (Signed)
Yes, pls order those and phenobarbital level.  Thx. Dr. Lovena Le

## 2020-06-23 NOTE — Telephone Encounter (Signed)
Patient notified

## 2020-06-26 DIAGNOSIS — Z79899 Other long term (current) drug therapy: Secondary | ICD-10-CM | POA: Diagnosis not present

## 2020-06-26 DIAGNOSIS — E785 Hyperlipidemia, unspecified: Secondary | ICD-10-CM | POA: Diagnosis not present

## 2020-06-26 DIAGNOSIS — I10 Essential (primary) hypertension: Secondary | ICD-10-CM | POA: Diagnosis not present

## 2020-06-26 DIAGNOSIS — C61 Malignant neoplasm of prostate: Secondary | ICD-10-CM | POA: Diagnosis not present

## 2020-06-27 ENCOUNTER — Other Ambulatory Visit: Payer: Self-pay

## 2020-06-27 ENCOUNTER — Ambulatory Visit (INDEPENDENT_AMBULATORY_CARE_PROVIDER_SITE_OTHER): Payer: Medicare Other | Admitting: Family Medicine

## 2020-06-27 ENCOUNTER — Encounter: Payer: Self-pay | Admitting: Family Medicine

## 2020-06-27 VITALS — BP 145/84 | HR 71 | Temp 97.4°F | Ht 68.0 in | Wt 205.0 lb

## 2020-06-27 DIAGNOSIS — N400 Enlarged prostate without lower urinary tract symptoms: Secondary | ICD-10-CM

## 2020-06-27 DIAGNOSIS — I10 Essential (primary) hypertension: Secondary | ICD-10-CM

## 2020-06-27 DIAGNOSIS — R7303 Prediabetes: Secondary | ICD-10-CM

## 2020-06-27 DIAGNOSIS — G40909 Epilepsy, unspecified, not intractable, without status epilepticus: Secondary | ICD-10-CM | POA: Diagnosis not present

## 2020-06-27 DIAGNOSIS — E785 Hyperlipidemia, unspecified: Secondary | ICD-10-CM | POA: Diagnosis not present

## 2020-06-27 LAB — LIPID PANEL
Chol/HDL Ratio: 3.7 ratio (ref 0.0–5.0)
Cholesterol, Total: 142 mg/dL (ref 100–199)
HDL: 38 mg/dL — ABNORMAL LOW (ref >39)
LDL Chol Calc (NIH): 83 mg/dL (ref 0–99)
Triglycerides: 115 mg/dL (ref 0–149)
VLDL Cholesterol Cal: 21 mg/dL (ref 5–40)

## 2020-06-27 LAB — CBC WITH DIFFERENTIAL/PLATELET
Basophils Absolute: 0 10*3/uL (ref 0.0–0.2)
Basos: 1 %
EOS (ABSOLUTE): 0.1 10*3/uL (ref 0.0–0.4)
Eos: 2 %
Hematocrit: 38.5 % (ref 37.5–51.0)
Hemoglobin: 13.3 g/dL (ref 13.0–17.7)
Immature Grans (Abs): 0 10*3/uL (ref 0.0–0.1)
Immature Granulocytes: 0 %
Lymphocytes Absolute: 1.6 10*3/uL (ref 0.7–3.1)
Lymphs: 30 %
MCH: 29.6 pg (ref 26.6–33.0)
MCHC: 34.5 g/dL (ref 31.5–35.7)
MCV: 86 fL (ref 79–97)
Monocytes Absolute: 0.3 10*3/uL (ref 0.1–0.9)
Monocytes: 6 %
Neutrophils Absolute: 3.2 10*3/uL (ref 1.4–7.0)
Neutrophils: 61 %
Platelets: 173 10*3/uL (ref 150–450)
RBC: 4.49 x10E6/uL (ref 4.14–5.80)
RDW: 13.3 % (ref 11.6–15.4)
WBC: 5.3 10*3/uL (ref 3.4–10.8)

## 2020-06-27 LAB — COMPREHENSIVE METABOLIC PANEL
ALT: 16 IU/L (ref 0–44)
AST: 21 IU/L (ref 0–40)
Albumin/Globulin Ratio: 1.7 (ref 1.2–2.2)
Albumin: 4.5 g/dL (ref 3.7–4.7)
Alkaline Phosphatase: 209 IU/L — ABNORMAL HIGH (ref 44–121)
BUN/Creatinine Ratio: 14 (ref 10–24)
BUN: 10 mg/dL (ref 8–27)
Bilirubin Total: <0.2 mg/dL (ref 0.0–1.2)
CO2: 21 mmol/L (ref 20–29)
Calcium: 8.8 mg/dL (ref 8.6–10.2)
Chloride: 100 mmol/L (ref 96–106)
Creatinine, Ser: 0.69 mg/dL — ABNORMAL LOW (ref 0.76–1.27)
GFR calc Af Amer: 109 mL/min/{1.73_m2} (ref >59)
GFR calc non Af Amer: 94 mL/min/{1.73_m2} (ref >59)
Globulin, Total: 2.6 g/dL (ref 1.5–4.5)
Glucose: 125 mg/dL — ABNORMAL HIGH (ref 65–99)
Potassium: 4.2 mmol/L (ref 3.5–5.2)
Sodium: 139 mmol/L (ref 134–144)
Total Protein: 7.1 g/dL (ref 6.0–8.5)

## 2020-06-27 LAB — PHENOBARBITAL LEVEL: Phenobarbital, Serum: 13 ug/mL — ABNORMAL LOW (ref 15–40)

## 2020-06-27 LAB — PHENYTOIN LEVEL, TOTAL: Phenytoin (Dilantin), Serum: 11.9 ug/mL (ref 10.0–20.0)

## 2020-06-27 LAB — PSA: Prostate Specific Ag, Serum: 0.2 ng/mL (ref 0.0–4.0)

## 2020-06-27 LAB — HEPATIC FUNCTION PANEL: Bilirubin, Direct: <0.1 mg/dL (ref 0.00–0.40)

## 2020-06-27 MED ORDER — PHENYTOIN SODIUM EXTENDED 100 MG PO CAPS: ORAL_CAPSULE | ORAL | 5 refills | Status: DC

## 2020-06-27 MED ORDER — PHENOBARBITAL 30 MG PO TABS
ORAL_TABLET | ORAL | 1 refills | Status: DC
Start: 2020-06-27 — End: 2020-12-25

## 2020-06-27 MED ORDER — SIMVASTATIN 20 MG PO TABS: ORAL_TABLET | ORAL | 1 refills | Status: DC

## 2020-06-27 MED ORDER — TAMSULOSIN HCL 0.4 MG PO CAPS
ORAL_CAPSULE | ORAL | 1 refills | Status: DC
Start: 2020-06-27 — End: 2020-12-25

## 2020-06-27 NOTE — Progress Notes (Signed)
Patient ID: Brent Suarez, male    DOB: 1945-08-10, 75 y.o.   MRN: [id number]   Chief Complaint  Patient presents with  . Seizures  . Results   Subjective:    HPI  CC-follow up on blood work and seizure disorder.  Has elevated Alk phos in past, been stable.   Slight dec phenobarbital at 13.  No break through seizures.  Doing well on medications.  Taking phenobarbital and phenytoin.  Using [facility], but just changed insurances. Using [employer].  Not normally had elevated bp.  Today bp elevated.   Medical History Brent Suarez has a past medical history of Epilepsy ([place]), Glucose intolerance (impaired glucose tolerance), Hyperlipidemia, Hypertension, Prostate cancer ([place]) (10/10/11), SBO (small bowel obstruction) ([place]) (10/19/210), Seizures ([place]), and UTI (lower urinary tract infection).   Outpatient Encounter Medications as of 06/27/2020  Medication Sig  . aspirin 81 MG tablet Take 81 mg by mouth every evening.   . cholecalciferol (VITAMIN D) 1000 units tablet Take 1 tablet (1,000 Units total) by mouth at bedtime.  . Omega-3 Fatty Acids (FISH OIL) 1000 MG CAPS Take 1 capsule by mouth at bedtime.  . polyethylene glycol powder (GLYCOLAX/MIRALAX) powder Take 17 g by mouth 2 (two) times daily.  . vitamin C (ASCORBIC ACID) 500 MG tablet Take 500 mg by mouth at bedtime.  . [DISCONTINUED] PHENObarbital (LUMINAL) 30 MG tablet TAKE 3 TABLETS AT BEDTIME  . [DISCONTINUED] phenytoin (DILANTIN) 100 MG ER capsule TAKE 3 CAPSULES(300 MG) BY MOUTH AT BEDTIME.  . [DISCONTINUED] simvastatin (ZOCOR) 20 MG tablet TAKE 1 TABLET(20 MG) BY MOUTH DAILY  . [DISCONTINUED] tamsulosin (FLOMAX) 0.4 MG CAPS capsule Take one capsule by mouth every evening with food  . PHENObarbital (LUMINAL) 30 MG tablet TAKE 3 TABLETS AT BEDTIME  . phenytoin (DILANTIN) 100 MG ER capsule TAKE 3 CAPSULES(300 MG) BY MOUTH AT BEDTIME.  . simvastatin (ZOCOR) 20 MG tablet TAKE 1 TABLET(20 MG) BY MOUTH DAILY  .  tamsulosin (FLOMAX) 0.4 MG CAPS capsule Take one capsule by mouth every evening with food   No facility-administered encounter medications on file as of 06/27/2020.     Review of Systems  Constitutional: Negative for chills and fever.  HENT: Negative for congestion, rhinorrhea and sore throat.   Respiratory: Negative for cough, shortness of breath and wheezing.   Cardiovascular: Negative for chest pain and leg swelling.  Gastrointestinal: Negative for abdominal pain, diarrhea, nausea and vomiting.  Genitourinary: Negative for dysuria and frequency.  Skin: Negative for rash.  Neurological: Negative for dizziness, seizures, weakness and headaches.     Vitals BP (!) 145/84   Pulse 71   Temp (!) 97.4 F (36.3 C)   Ht 5' 8" (1.727 m)   Wt 205 lb (93 kg)   SpO2 99%   BMI 31.17 kg/m   Objective:   Physical Exam Vitals and nursing note reviewed.  Constitutional:      General: He is not in acute distress.    Appearance: Normal appearance. He is not ill-appearing.  HENT:     Head: Normocephalic.     Nose: Nose normal. No congestion.     Mouth/Throat:     Mouth: Mucous membranes are moist.     Pharynx: No oropharyngeal exudate.  Eyes:     Extraocular Movements: Extraocular movements intact.     Conjunctiva/sclera: Conjunctivae normal.     Pupils: Pupils are equal, round, and reactive to light.  Cardiovascular:     Rate and Rhythm: Normal rate and  regular rhythm.     Pulses: Normal pulses.     Heart sounds: Normal heart sounds. No murmur heard.   Pulmonary:     Effort: Pulmonary effort is normal.     Breath sounds: Normal breath sounds. No wheezing, rhonchi or rales.  Musculoskeletal:        General: Normal range of motion.     Right lower leg: No edema.     Left lower leg: No edema.  Skin:    General: Skin is warm and dry.     Findings: No rash.  Neurological:     General: No focal deficit present.     Mental Status: He is alert and oriented to person, place, and  time.     Cranial Nerves: No cranial nerve deficit.  Psychiatric:        Mood and Affect: Mood normal.        Behavior: Behavior normal.        Thought Content: Thought content normal.        Judgment: Judgment normal.      Assessment and Plan   1. Seizure disorder (HCC) - phenytoin (DILANTIN) 100 MG ER capsule; TAKE 3 CAPSULES(300 MG) BY MOUTH AT BEDTIME.  Dispense: 90 capsule; Refill: 5 - PHENObarbital (LUMINAL) 30 MG tablet; TAKE 3 TABLETS AT BEDTIME  Dispense: 270 tablet; Refill: 1  2. Hyperlipidemia LDL goal <130 - simvastatin (ZOCOR) 20 MG tablet; TAKE 1 TABLET(20 MG) BY MOUTH DAILY  Dispense: 90 tablet; Refill: 1  3. Primary hypertension  4. Prostate hypertrophy - tamsulosin (FLOMAX) 0.4 MG CAPS capsule; Take one capsule by mouth every evening with food  Dispense: 90 capsule; Refill: 1  5. Prediabetes    Elevated BP- w/o dx htn.-  Apparently in 2018 was dx with htn, but not put on medications. Pt wanting to work on diet modification and dec salt in diet.  Stating normally his bp runs around 130s.  Will recheck on next visit.  Seizure d/o-stable.  Cont to monitor.  Elevated alk phos- might be related to his seizure medications.  Phenobarb level is slight low at 13.  Will cont to monitor, since no break through seizures.  Phenytoin- normal level.  HLD_ stable. Cont meds.  Prediabetes- pt wasn't fasting when labs were checked. Glucose 125.  F/u 26mofor labs and recheck.

## 2020-07-09 ENCOUNTER — Encounter: Payer: Self-pay | Admitting: Family Medicine

## 2020-08-04 DIAGNOSIS — I8311 Varicose veins of right lower extremity with inflammation: Secondary | ICD-10-CM | POA: Diagnosis not present

## 2020-08-04 DIAGNOSIS — I83891 Varicose veins of right lower extremities with other complications: Secondary | ICD-10-CM | POA: Diagnosis not present

## 2020-08-04 DIAGNOSIS — I83811 Varicose veins of right lower extremities with pain: Secondary | ICD-10-CM | POA: Diagnosis not present

## 2020-08-17 DIAGNOSIS — I8311 Varicose veins of right lower extremity with inflammation: Secondary | ICD-10-CM | POA: Diagnosis not present

## 2020-09-06 DIAGNOSIS — I8311 Varicose veins of right lower extremity with inflammation: Secondary | ICD-10-CM | POA: Diagnosis not present

## 2020-09-06 DIAGNOSIS — I83811 Varicose veins of right lower extremities with pain: Secondary | ICD-10-CM | POA: Diagnosis not present

## 2020-09-20 DIAGNOSIS — I8311 Varicose veins of right lower extremity with inflammation: Secondary | ICD-10-CM | POA: Diagnosis not present

## 2020-09-20 DIAGNOSIS — I83891 Varicose veins of right lower extremities with other complications: Secondary | ICD-10-CM | POA: Diagnosis not present

## 2020-09-20 DIAGNOSIS — I83811 Varicose veins of right lower extremities with pain: Secondary | ICD-10-CM | POA: Diagnosis not present

## 2020-09-26 DIAGNOSIS — I8311 Varicose veins of right lower extremity with inflammation: Secondary | ICD-10-CM | POA: Diagnosis not present

## 2020-10-10 ENCOUNTER — Telehealth: Payer: Self-pay | Admitting: Family Medicine

## 2020-10-10 DIAGNOSIS — Z20822 Contact with and (suspected) exposure to covid-19: Secondary | ICD-10-CM | POA: Diagnosis not present

## 2020-10-10 NOTE — Telephone Encounter (Signed)
Left message for patient to call back and schedule Medicare Annual Wellness Visit (AWV) in office.   If not able to come in office, please offer to do virtually or by telephone.   Last AWV:11/10/2018   Please schedule at anytime with Nurse Health Advisor.

## 2020-10-13 DIAGNOSIS — I8311 Varicose veins of right lower extremity with inflammation: Secondary | ICD-10-CM | POA: Diagnosis not present

## 2020-10-13 DIAGNOSIS — I83811 Varicose veins of right lower extremities with pain: Secondary | ICD-10-CM | POA: Diagnosis not present

## 2020-10-30 DIAGNOSIS — I83811 Varicose veins of right lower extremities with pain: Secondary | ICD-10-CM | POA: Diagnosis not present

## 2020-10-30 DIAGNOSIS — I8311 Varicose veins of right lower extremity with inflammation: Secondary | ICD-10-CM | POA: Diagnosis not present

## 2020-11-09 DIAGNOSIS — Z20822 Contact with and (suspected) exposure to covid-19: Secondary | ICD-10-CM | POA: Diagnosis not present

## 2020-11-20 DIAGNOSIS — I8311 Varicose veins of right lower extremity with inflammation: Secondary | ICD-10-CM | POA: Diagnosis not present

## 2020-12-06 DIAGNOSIS — Z20822 Contact with and (suspected) exposure to covid-19: Secondary | ICD-10-CM | POA: Diagnosis not present

## 2020-12-18 DIAGNOSIS — I83811 Varicose veins of right lower extremities with pain: Secondary | ICD-10-CM | POA: Diagnosis not present

## 2020-12-18 DIAGNOSIS — M7981 Nontraumatic hematoma of soft tissue: Secondary | ICD-10-CM | POA: Diagnosis not present

## 2020-12-18 DIAGNOSIS — I8311 Varicose veins of right lower extremity with inflammation: Secondary | ICD-10-CM | POA: Diagnosis not present

## 2020-12-25 ENCOUNTER — Ambulatory Visit (INDEPENDENT_AMBULATORY_CARE_PROVIDER_SITE_OTHER): Payer: Medicare Other | Admitting: Family Medicine

## 2020-12-25 ENCOUNTER — Other Ambulatory Visit: Payer: Self-pay

## 2020-12-25 DIAGNOSIS — N400 Enlarged prostate without lower urinary tract symptoms: Secondary | ICD-10-CM | POA: Diagnosis not present

## 2020-12-25 DIAGNOSIS — G40909 Epilepsy, unspecified, not intractable, without status epilepticus: Secondary | ICD-10-CM

## 2020-12-25 DIAGNOSIS — E785 Hyperlipidemia, unspecified: Secondary | ICD-10-CM | POA: Diagnosis not present

## 2020-12-25 MED ORDER — PHENYTOIN SODIUM EXTENDED 100 MG PO CAPS: ORAL_CAPSULE | ORAL | 5 refills | Status: DC

## 2020-12-25 MED ORDER — PHENOBARBITAL 30 MG PO TABS: ORAL_TABLET | ORAL | 1 refills | Status: DC

## 2020-12-25 MED ORDER — TAMSULOSIN HCL 0.4 MG PO CAPS: ORAL_CAPSULE | ORAL | 1 refills | Status: DC

## 2020-12-25 MED ORDER — SIMVASTATIN 20 MG PO TABS: ORAL_TABLET | ORAL | 1 refills | Status: DC

## 2020-12-25 NOTE — Progress Notes (Signed)
Patient ID: Brent Suarez, male    DOB: 27-May-1945, 75 y.o.   MRN: [id number]   Chief Complaint  Patient presents with   Seizures    Medication refills    Hyperlipidemia    Med refills   Subjective:    HPI F/u seizure disorder-  Pt stating not sure if he's skipping his seizure meds.  Pt stating he is thinking he's taking them daily. Not having break through seizures.  Keeping pills in bottle and taking meds at night.  Pt not seeing neurology.   His last pcp took over his seizure medications. Has been on these medications for years, at least since 2013, at start of this computer chart.  HLD- doing well no new concerns.  Compliant with meds. No chest pain, palpitations, myalgias or joint pains.   Medical History Brent Suarez has a past medical history of Epilepsy ([place]), Glucose intolerance (impaired glucose tolerance), Hyperlipidemia, Hypertension, Prostate cancer ([place]) (10/10/11), SBO (small bowel obstruction) ([place]) (10/19/210), Seizures ([place]), and UTI (lower urinary tract infection).   Outpatient Encounter Medications as of 12/25/2020  Medication Sig   aspirin 81 MG tablet Take 81 mg by mouth every evening.    cholecalciferol (VITAMIN D) 1000 units tablet Take 1 tablet (1,000 Units total) by mouth at bedtime.   Omega-3 Fatty Acids (FISH OIL) 1000 MG CAPS Take 1 capsule by mouth at bedtime.   PHENObarbital (LUMINAL) 30 MG tablet TAKE 3 TABLETS AT BEDTIME   phenytoin (DILANTIN) 100 MG ER capsule TAKE 3 CAPSULES(300 MG) BY MOUTH AT BEDTIME.   polyethylene glycol powder (GLYCOLAX/MIRALAX) powder Take 17 g by mouth 2 (two) times daily.   vitamin C (ASCORBIC ACID) 500 MG tablet Take 500 mg by mouth at bedtime.   [DISCONTINUED] simvastatin (ZOCOR) 20 MG tablet TAKE 1 TABLET(20 MG) BY MOUTH DAILY   [DISCONTINUED] tamsulosin (FLOMAX) 0.4 MG CAPS capsule Take one capsule by mouth every evening with food   simvastatin (ZOCOR) 20 MG tablet TAKE 1 TABLET(20 MG) BY MOUTH DAILY   tamsulosin  (FLOMAX) 0.4 MG CAPS capsule Take one capsule by mouth every evening with food   No facility-administered encounter medications on file as of 12/25/2020.     Review of Systems  Constitutional:  Negative for chills and fever.  HENT:  Negative for congestion, rhinorrhea and sore throat.   Respiratory:  Negative for cough, shortness of breath and wheezing.   Cardiovascular:  Negative for chest pain and leg swelling.  Gastrointestinal:  Negative for abdominal pain, diarrhea, nausea and vomiting.  Genitourinary:  Negative for dysuria and frequency.  Skin:  Negative for rash.  Neurological:  Positive for seizures (controlled on meds). Negative for dizziness, weakness and headaches.    Vitals BP 120/76   Pulse 68   Temp (!) 97.3 F (36.3 C)   Ht 5' 8"  (1.727 m)   Wt 197 lb (89.4 kg)   SpO2 96%   BMI 29.95 kg/m   Objective:   Physical Exam Vitals and nursing note reviewed.  Constitutional:      General: He is not in acute distress.    Appearance: Normal appearance. He is not ill-appearing.  HENT:     Head: Normocephalic.  Eyes:     Extraocular Movements: Extraocular movements intact.     Conjunctiva/sclera: Conjunctivae normal.     Pupils: Pupils are equal, round, and reactive to light.  Cardiovascular:     Rate and Rhythm: Normal rate and regular rhythm.     Pulses: Normal pulses.  Heart sounds: Normal heart sounds. No murmur heard. Pulmonary:     Effort: Pulmonary effort is normal.     Breath sounds: Normal breath sounds. No wheezing, rhonchi or rales.  Musculoskeletal:        General: Normal range of motion.     Right lower leg: No edema.     Left lower leg: No edema.  Skin:    General: Skin is warm and dry.     Findings: No rash.  Neurological:     General: No focal deficit present.     Mental Status: He is alert and oriented to person, place, and time.     Cranial Nerves: No cranial nerve deficit.  Psychiatric:        Mood and Affect: Mood normal.         Behavior: Behavior normal.        Thought Content: Thought content normal.        Judgment: Judgment normal.     Assessment and Plan   1. Seizure disorder (HCC) - Phenobarbital level - Dilantin (Phenytoin) level, total  2. Prostate hypertrophy - tamsulosin (FLOMAX) 0.4 MG CAPS capsule; Take one capsule by mouth every evening with food  Dispense: 90 capsule; Refill: 1  3. Hyperlipidemia LDL goal <130 - simvastatin (ZOCOR) 20 MG tablet; TAKE 1 TABLET(20 MG) BY MOUTH DAILY  Dispense: 90 tablet; Refill: 1 - CMP14+EGFR - Lipid panel   Seizure disorder- stable.  Cont meds. Will recheck levels.  Bph- stable. Cont flomax.   HLD- stable. Cont zocor.  No follow-ups on file.

## 2020-12-26 LAB — LIPID PANEL
Chol/HDL Ratio: 3.7 ratio (ref 0.0–5.0)
Cholesterol, Total: 132 mg/dL (ref 100–199)
HDL: 36 mg/dL — ABNORMAL LOW (ref >39)
LDL Chol Calc (NIH): 71 mg/dL (ref 0–99)
Triglycerides: 139 mg/dL (ref 0–149)
VLDL Cholesterol Cal: 25 mg/dL (ref 5–40)

## 2020-12-26 LAB — CMP14+EGFR
ALT: 10 IU/L (ref 0–44)
AST: 21 IU/L (ref 0–40)
Albumin/Globulin Ratio: 1.6 (ref 1.2–2.2)
Albumin: 4.4 g/dL (ref 3.7–4.7)
Alkaline Phosphatase: 213 IU/L — ABNORMAL HIGH (ref 44–121)
BUN/Creatinine Ratio: 15 (ref 10–24)
BUN: 10 mg/dL (ref 8–27)
Bilirubin Total: <0.2 mg/dL (ref 0.0–1.2)
CO2: 26 mmol/L (ref 20–29)
Calcium: 8.9 mg/dL (ref 8.6–10.2)
Chloride: 103 mmol/L (ref 96–106)
Creatinine, Ser: 0.68 mg/dL — ABNORMAL LOW (ref 0.76–1.27)
Globulin, Total: 2.7 g/dL (ref 1.5–4.5)
Glucose: 74 mg/dL (ref 65–99)
Potassium: 4.3 mmol/L (ref 3.5–5.2)
Sodium: 141 mmol/L (ref 134–144)
Total Protein: 7.1 g/dL (ref 6.0–8.5)
eGFR: 98 mL/min/{1.73_m2} (ref >59)

## 2020-12-26 LAB — PHENOBARBITAL LEVEL: Phenobarbital, Serum: 15 ug/mL (ref 15–40)

## 2020-12-26 LAB — PHENYTOIN LEVEL, TOTAL: Phenytoin (Dilantin), Serum: 11.8 ug/mL (ref 10.0–20.0)

## 2021-01-29 ENCOUNTER — Telehealth: Payer: Self-pay | Admitting: Family Medicine

## 2021-01-29 NOTE — Telephone Encounter (Signed)
Left message for patient to call back and schedule Medicare Annual Wellness Visit (AWV) in office.   If unable to come into the office for AWV,  please offer to do virtually or by telephone.  Last AWV: 11/10/2018  Please schedule at anytime with [facility]-Nurse Health Advisor.  40 minute appointment  Any questions, please contact me at [phone]

## 2021-02-01 DIAGNOSIS — Z20822 Contact with and (suspected) exposure to covid-19: Secondary | ICD-10-CM | POA: Diagnosis not present

## 2021-03-07 ENCOUNTER — Other Ambulatory Visit: Payer: Self-pay

## 2021-03-07 ENCOUNTER — Other Ambulatory Visit (INDEPENDENT_AMBULATORY_CARE_PROVIDER_SITE_OTHER): Payer: Medicare Other

## 2021-03-07 DIAGNOSIS — Z23 Encounter for immunization: Secondary | ICD-10-CM | POA: Diagnosis not present

## 2021-03-19 DIAGNOSIS — Z20822 Contact with and (suspected) exposure to covid-19: Secondary | ICD-10-CM | POA: Diagnosis not present

## 2021-03-21 DIAGNOSIS — Z23 Encounter for immunization: Secondary | ICD-10-CM | POA: Diagnosis not present

## 2021-03-22 DIAGNOSIS — Z20822 Contact with and (suspected) exposure to covid-19: Secondary | ICD-10-CM | POA: Diagnosis not present

## 2021-05-21 DIAGNOSIS — Z20822 Contact with and (suspected) exposure to covid-19: Secondary | ICD-10-CM | POA: Diagnosis not present

## 2021-05-30 DIAGNOSIS — Z20822 Contact with and (suspected) exposure to covid-19: Secondary | ICD-10-CM | POA: Diagnosis not present

## 2021-05-31 ENCOUNTER — Other Ambulatory Visit: Payer: Self-pay

## 2021-05-31 ENCOUNTER — Telehealth: Payer: Self-pay | Admitting: Family Medicine

## 2021-05-31 ENCOUNTER — Ambulatory Visit (INDEPENDENT_AMBULATORY_CARE_PROVIDER_SITE_OTHER): Payer: Medicare Other | Admitting: Family Medicine

## 2021-05-31 VITALS — BP 132/82 | HR 96 | Temp 100.0°F | Ht 68.0 in | Wt 205.0 lb

## 2021-05-31 DIAGNOSIS — J111 Influenza due to unidentified influenza virus with other respiratory manifestations: Secondary | ICD-10-CM | POA: Insufficient documentation

## 2021-05-31 DIAGNOSIS — R051 Acute cough: Secondary | ICD-10-CM | POA: Diagnosis not present

## 2021-05-31 NOTE — Patient Instructions (Signed)
Rest. Lots of fluids.  Tylenol 1000 mg three times daily as needed.  We will get the results to you as soon as we can.  Take care  Dr. Lacinda Axon

## 2021-05-31 NOTE — Telephone Encounter (Signed)
Left message for patient to call back and schedule Medicare Annual Wellness Visit (AWV) to be completed by video or phone.   Last AWV: 11/10/2018  Please schedule at anytime with [place]  45 minute appointment  Any questions, please contact me at [phone]

## 2021-05-31 NOTE — Progress Notes (Signed)
Subjective:  Patient ID: Brent Suarez, male    DOB: 05-09-1946  Age: 76 y.o. MRN: [id number]  CC: Chief Complaint  Patient presents with   Cough    Body aches since last night- fatigue    HPI:  76 year old male presents for evaluation of body aches, mild cough, and chills.  Patient reports that his symptoms began last night. He reports chills, body aches. Has had a slight cough. No reported fever. Temp elevated here @ 100. No reported sick contacts. No medications or interventions tried. No other associated symptoms.  Patient Active Problem List   Diagnosis Date Noted   Influenza-like illness 05/31/2021   Prediabetes 09/09/2017   Hypertension 04/05/2017   Mucosal abnormality of colon    History of colon cancer 01/31/2015   Elevated alkaline phosphatase level 04/15/2013   Seizure disorder ([place]) 02/04/2013   Prostate hypertrophy 02/04/2013   Hyperlipidemia LDL goal <130 02/04/2013   Prostate cancer ([place]) 10/10/2011    Social Hx   Social History   Socioeconomic History   Marital status: Married    Spouse name: Not on file   Number of children: 2   Years of education: Not on file   Highest education level: Not on file  Occupational History    Employer: [employer]  Tobacco Use   Smoking status: Former    Packs/day: 1.00    Years: 25.00    Pack years: 25.00    Types: Cigarettes    Quit date: 05/13/1988    Years since quitting: 33.0   Smokeless tobacco: Never  Substance and Sexual Activity   Alcohol use: No    Alcohol/week: 0.0 standard drinks   Drug use: No   Sexual activity: Not Currently  Other Topics Concern   Not on file  Social History Narrative   Not on file   Social Determinants of Health   Financial Resource Strain: Not on file  Food Insecurity: Not on file  Transportation Needs: Not on file  Physical Activity: Not on file  Stress: Not on file  Social Connections: Not on file    Review of Systems Per HPI  Objective:  BP 132/82    Pulse 96     Temp 100 F (37.8 C) (Oral)    Ht 5\' 8"  (1.727 m)    Wt 205 lb (93 kg)    SpO2 98%    BMI 31.17 kg/m   BP/Weight 05/31/2021 12/25/2020 0/35/0093  Systolic BP 818 299 371  Diastolic BP 82 76 84  Wt. (Lbs) 205 197 205  BMI 31.17 29.95 31.17    Physical Exam Constitutional:      General: He is not in acute distress.    Appearance: Normal appearance. He is not ill-appearing.  HENT:     Head: Normocephalic and atraumatic.  Eyes:     General:        Right eye: No discharge.        Left eye: No discharge.     Conjunctiva/sclera: Conjunctivae normal.  Cardiovascular:     Rate and Rhythm: Normal rate and regular rhythm.     Heart sounds: No murmur heard. Pulmonary:     Effort: Pulmonary effort is normal.     Breath sounds: Normal breath sounds. No wheezing or rales.  Neurological:     Mental Status: He is alert.  Psychiatric:        Mood and Affect: Mood normal.        Behavior: Behavior normal.  Lab Results  Component Value Date   WBC 5.3 06/26/2020   HGB 13.3 06/26/2020   HCT 38.5 06/26/2020   PLT 173 06/26/2020   GLUCOSE 74 12/25/2020   CHOL 132 12/25/2020   TRIG 139 12/25/2020   HDL 36 (L) 12/25/2020   LDLCALC 71 12/25/2020   ALT 10 12/25/2020   AST 21 12/25/2020   NA 141 12/25/2020   K 4.3 12/25/2020   CL 103 12/25/2020   CREATININE 0.68 (L) 12/25/2020   BUN 10 12/25/2020   CO2 26 12/25/2020   INR 1.03 02/14/2009     Assessment & Plan:   Problem List Items Addressed This Visit       Other   Influenza-like illness - Primary    Concern for flu or possibly COVID. Awaiting PCR test results. Exam benign today. Lungs clear. Tylenol as needed for fever, body aches.      Relevant Orders   COVID-19, Flu A+B and RSV    [place] DO [place]

## 2021-05-31 NOTE — Assessment & Plan Note (Signed)
Concern for flu or possibly COVID. Awaiting PCR test results. Exam benign today. Lungs clear. Tylenol as needed for fever, body aches.

## 2021-06-01 ENCOUNTER — Other Ambulatory Visit: Payer: Self-pay | Admitting: Family Medicine

## 2021-06-01 LAB — COVID-19, FLU A+B AND RSV
Influenza A, NAA: NOT DETECTED
Influenza B, NAA: NOT DETECTED
RSV, NAA: NOT DETECTED
SARS-CoV-2, NAA: DETECTED — AB

## 2021-06-01 MED ORDER — MOLNUPIRAVIR EUA 200MG CAPSULE: 4.0000 | ORAL_CAPSULE | Freq: Two times a day (BID) | ORAL | 0 refills | Status: AC

## 2021-06-01 NOTE — Progress Notes (Signed)
COVID positive. Spoke with patient.  Placing on molnupiravir given interaction Paxlovid's interaction with phenobarbital and phenytoin.  Ryan

## 2021-06-04 ENCOUNTER — Other Ambulatory Visit: Payer: Self-pay | Admitting: Family Medicine

## 2021-06-04 DIAGNOSIS — G40909 Epilepsy, unspecified, not intractable, without status epilepticus: Secondary | ICD-10-CM

## 2021-06-10 ENCOUNTER — Other Ambulatory Visit: Payer: Self-pay | Admitting: Family Medicine

## 2021-06-10 DIAGNOSIS — G40909 Epilepsy, unspecified, not intractable, without status epilepticus: Secondary | ICD-10-CM

## 2021-06-18 DIAGNOSIS — Z20822 Contact with and (suspected) exposure to covid-19: Secondary | ICD-10-CM | POA: Diagnosis not present

## 2021-06-21 DIAGNOSIS — Z20822 Contact with and (suspected) exposure to covid-19: Secondary | ICD-10-CM | POA: Diagnosis not present

## 2021-06-26 ENCOUNTER — Telehealth: Payer: Self-pay | Admitting: Family Medicine

## 2021-06-26 NOTE — Telephone Encounter (Signed)
Left message for patient to call back and schedule Medicare Annual Wellness Visit (AWV) in office.   If unable to come into the office for AWV,  please offer to do virtually or by telephone.  Last AWV: 11/10/2018  Please schedule at anytime with [facility]-Nurse Health Advisor.  40 minute appointment  Any questions, please contact me at [phone]

## 2021-06-27 ENCOUNTER — Other Ambulatory Visit: Payer: Self-pay | Admitting: Family Medicine

## 2021-06-27 ENCOUNTER — Telehealth: Payer: Self-pay | Admitting: Family Medicine

## 2021-06-27 ENCOUNTER — Telehealth: Payer: Self-pay

## 2021-06-27 DIAGNOSIS — G40909 Epilepsy, unspecified, not intractable, without status epilepticus: Secondary | ICD-10-CM

## 2021-06-27 MED ORDER — PHENOBARBITAL 30 MG PO TABS: 90.0000 mg | ORAL_TABLET | Freq: Every day | ORAL | 0 refills | Status: DC

## 2021-06-27 NOTE — Telephone Encounter (Signed)
Patient requesting refill on phenobarbital 30 mg called into [facility]. Patient would like all medication from here out sent to [facility] please

## 2021-06-27 NOTE — Telephone Encounter (Signed)
Error

## 2021-06-27 NOTE — Telephone Encounter (Signed)
Rx printed out and will be faxed to [facility] once signed by PCP. Pt wife contacted and verbalized understanding

## 2021-06-28 ENCOUNTER — Encounter: Payer: Self-pay | Admitting: Family Medicine

## 2021-06-28 ENCOUNTER — Ambulatory Visit: Payer: Medicare Other | Admitting: Family Medicine

## 2021-06-29 ENCOUNTER — Other Ambulatory Visit: Payer: Self-pay | Admitting: *Deleted

## 2021-06-29 DIAGNOSIS — N400 Enlarged prostate without lower urinary tract symptoms: Secondary | ICD-10-CM

## 2021-06-29 DIAGNOSIS — G40909 Epilepsy, unspecified, not intractable, without status epilepticus: Secondary | ICD-10-CM

## 2021-06-29 MED ORDER — PHENYTOIN SODIUM EXTENDED 100 MG PO CAPS: ORAL_CAPSULE | ORAL | 0 refills | Status: DC

## 2021-06-29 MED ORDER — TAMSULOSIN HCL 0.4 MG PO CAPS: ORAL_CAPSULE | ORAL | 0 refills | Status: DC

## 2021-06-29 MED ORDER — PHENOBARBITAL 30 MG PO TABS: 90.0000 mg | ORAL_TABLET | Freq: Every day | ORAL | 0 refills | Status: DC

## 2021-07-03 ENCOUNTER — Other Ambulatory Visit: Payer: Self-pay

## 2021-07-03 ENCOUNTER — Ambulatory Visit (INDEPENDENT_AMBULATORY_CARE_PROVIDER_SITE_OTHER): Payer: Medicare Other | Admitting: Family Medicine

## 2021-07-03 ENCOUNTER — Encounter: Payer: Self-pay | Admitting: Family Medicine

## 2021-07-03 VITALS — BP 118/68 | HR 106 | Temp 97.2°F | Ht 68.0 in | Wt 196.0 lb

## 2021-07-03 DIAGNOSIS — Z79899 Other long term (current) drug therapy: Secondary | ICD-10-CM

## 2021-07-03 DIAGNOSIS — E559 Vitamin D deficiency, unspecified: Secondary | ICD-10-CM | POA: Diagnosis not present

## 2021-07-03 DIAGNOSIS — E785 Hyperlipidemia, unspecified: Secondary | ICD-10-CM

## 2021-07-03 DIAGNOSIS — G40909 Epilepsy, unspecified, not intractable, without status epilepticus: Secondary | ICD-10-CM

## 2021-07-03 DIAGNOSIS — Z8546 Personal history of malignant neoplasm of prostate: Secondary | ICD-10-CM

## 2021-07-03 DIAGNOSIS — Z85038 Personal history of other malignant neoplasm of large intestine: Secondary | ICD-10-CM

## 2021-07-03 DIAGNOSIS — R748 Abnormal levels of other serum enzymes: Secondary | ICD-10-CM

## 2021-07-03 DIAGNOSIS — C61 Malignant neoplasm of prostate: Secondary | ICD-10-CM | POA: Diagnosis not present

## 2021-07-03 MED ORDER — SIMVASTATIN 20 MG PO TABS: ORAL_TABLET | ORAL | 1 refills | Status: DC

## 2021-07-03 NOTE — Assessment & Plan Note (Signed)
Lipid panel today. Continue Simvastatin.

## 2021-07-03 NOTE — Patient Instructions (Signed)
Continue your medications.  Labs when you can.  Follow up in 6 months.  Take care  Dr. Shaquel Josephson  

## 2021-07-03 NOTE — Assessment & Plan Note (Signed)
Checking PSA 

## 2021-07-03 NOTE — Assessment & Plan Note (Signed)
Checking Dilantin and Phenobarbital levels (as well as other recommend labs in regards to Dilantin). Recommend consideration for referral to neurology to taper meds and place patient on newer agent with less side effects. Patient to consider.

## 2021-07-03 NOTE — Progress Notes (Signed)
Subjective:  Patient ID: Brent Suarez, male    DOB: 1945/09/25  Age: 76 y.o. MRN: [id number]  CC: Chief Complaint  Patient presents with   6 month follow up     Seizures med check hyperlipidemia med refill    HPI:  76 year old male with a history of seizure disorder (remotely when he was a child) on long-term phenytoin and phenobarbital, prediabetes, history of prostate cancer, hyperlipidemia, history of colon cancer presents for follow-up.  Patient has not had a seizure in many years.  He is on phenobarbital and phenytoin.  Needs labs.  Has a history of hyperlipidemia.  Has been well controlled on simvastatin.  Needs current labs.  Chart reflects a history of hypertension.  However, patient is on no medication and is normotensive.  BPH stable on Flomax.  Has a remote history of prostate cancer status post radiation.  Needs PSA.  Patient also has a history of colon cancer s/p hemicolectomy. Last colonoscopy was in 2016. Will need to touch base with GI to see if he is due.  Patient Active Problem List   Diagnosis Date Noted   Prediabetes 09/09/2017   History of colon cancer 01/31/2015   Elevated alkaline phosphatase level 04/15/2013   Seizure disorder ([place]) 02/04/2013   Hyperlipidemia 02/04/2013   Prostate cancer ([place]) 10/10/2011    Social Hx   Social History   Socioeconomic History   Marital status: Married    Spouse name: Not on file   Number of children: 2   Years of education: Not on file   Highest education level: Not on file  Occupational History    Employer: [employer]  Tobacco Use   Smoking status: Former    Packs/day: 1.00    Years: 25.00    Pack years: 25.00    Types: Cigarettes    Quit date: 05/13/1988    Years since quitting: 33.1   Smokeless tobacco: Never  Substance and Sexual Activity   Alcohol use: No    Alcohol/week: 0.0 standard drinks   Drug use: No   Sexual activity: Not Currently  Other Topics Concern   Not on file  Social History  Narrative   Not on file   Social Determinants of Health   Financial Resource Strain: Not on file  Food Insecurity: Not on file  Transportation Needs: Not on file  Physical Activity: Not on file  Stress: Not on file  Social Connections: Not on file    Review of Systems  Constitutional: Negative.   Respiratory: Negative.    Cardiovascular: Negative.     Objective:  BP 118/68    Pulse (!) 106    Temp (!) 97.2 F (36.2 C)    Ht _0  (1.727 m)    Wt 196 lb (88.9 kg)    SpO2 95%    BMI 29.80 kg/m   BP/Weight 07/03/2021 05/31/2021 9/37/1696  Systolic BP 789 381 017  Diastolic BP 68 82 76  Wt. (Lbs) 196 205 197  BMI 29.8 31.17 29.95    Physical Exam Vitals and nursing note reviewed.  Constitutional:      Appearance: Normal appearance.  Cardiovascular:     Rate and Rhythm: Normal rate and regular rhythm.  Pulmonary:     Effort: Pulmonary effort is normal.     Breath sounds: Normal breath sounds. No wheezing or rales.  Neurological:     Mental Status: He is alert.  Psychiatric:        Mood and Affect: Mood  normal.        Behavior: Behavior normal.    Lab Results  Component Value Date   WBC 5.3 06/26/2020   HGB 13.3 06/26/2020   HCT 38.5 06/26/2020   PLT 173 06/26/2020   GLUCOSE 74 12/25/2020   CHOL 132 12/25/2020   TRIG 139 12/25/2020   HDL 36 (L) 12/25/2020   LDLCALC 71 12/25/2020   ALT 10 12/25/2020   AST 21 12/25/2020   NA 141 12/25/2020   K 4.3 12/25/2020   CL 103 12/25/2020   CREATININE 0.68 (L) 12/25/2020   BUN 10 12/25/2020   CO2 26 12/25/2020   INR 1.03 02/14/2009     Assessment & Plan:   Problem List Items Addressed This Visit       Nervous and Auditory   Seizure disorder ([place]) - Primary    Checking Dilantin and Phenobarbital levels (as well as other recommend labs in regards to Dilantin). Recommend consideration for referral to neurology to taper meds and place patient on newer agent with less side effects. Patient to consider.       Relevant Orders   CMP14+EGFR   Phenobarbital level   Phenytoin level, total     Genitourinary   Prostate cancer ([facility])    Checking PSA.        Other   Hyperlipidemia    Lipid panel today. Continue Simvastatin.      Relevant Medications   simvastatin (ZOCOR) 20 MG tablet   Other Relevant Orders   Lipid panel   History of colon cancer   Other Visit Diagnoses     History of prostate cancer       Relevant Orders   PSA   High risk medication use       Relevant Orders   CBC   CMP14+EGFR   Vitamin D (25 hydroxy)   Vitamin D deficiency       Relevant Orders   Vitamin D (25 hydroxy)       Meds ordered this encounter  Medications   simvastatin (ZOCOR) 20 MG tablet    Sig: TAKE 1 TABLET(20 MG) BY MOUTH DAILY    Dispense:  90 tablet    Refill:  1    Follow-up:  Return in about 6 months (around 12/31/2021).  [place]

## 2021-07-10 ENCOUNTER — Ambulatory Visit (INDEPENDENT_AMBULATORY_CARE_PROVIDER_SITE_OTHER): Payer: Medicare Other

## 2021-07-10 ENCOUNTER — Other Ambulatory Visit: Payer: Self-pay

## 2021-07-10 VITALS — Ht 68.0 in | Wt 199.8 lb

## 2021-07-10 DIAGNOSIS — Z0000 Encounter for general adult medical examination without abnormal findings: Secondary | ICD-10-CM | POA: Diagnosis not present

## 2021-07-10 NOTE — Patient Instructions (Signed)
Brent Suarez , Thank you for taking time to come for your Medicare Wellness Visit. I appreciate your ongoing commitment to your health goals. Please review the following plan we discussed and let me know if I can assist you in the future.   Screening recommendations/referrals: Colonoscopy: Done 02/17/2015 Repeat in 10 years  Recommended yearly ophthalmology/optometry visit for glaucoma screening and checkup Recommended yearly dental visit for hygiene and checkup  Vaccinations: Influenza vaccine: Done 03/07/2021 Repeat annually  Pneumococcal vaccine: Done 11/01/2014 and 03/10/2017 Tdap vaccine: Due Repeat in 10 years  Shingles vaccine: Zoster done 01/05/2013. Discussed Shingrix.   Covid-19: Done 06/29/2019 and 07/27/2019  Advanced directives: Advance directive discussed with you today. Even though you declined this today, please call our office should you change your mind, and we can give you the proper paperwork for you to fill out.   Conditions/risks identified: Aim for 30 minutes of exercise or brisk walking, 6-8 glasses of water, and 5 servings of fruits and vegetables each day.   Next appointment: Follow up in one year for your annual wellness visit. 2024.  Preventive Care 75 Years and Older, Male  Preventive care refers to lifestyle choices and visits with your health care provider that can promote health and wellness. What does preventive care include? A yearly physical exam. This is also called an annual well check. Dental exams once or twice a year. Routine eye exams. Ask your health care provider how often you should have your eyes checked. Personal lifestyle choices, including: Daily care of your teeth and gums. Regular physical activity. Eating a healthy diet. Avoiding tobacco and drug use. Limiting alcohol use. Practicing safe sex. Taking low doses of aspirin every day. Taking vitamin and mineral supplements as recommended by your health care provider. What happens  during an annual well check? The services and screenings done by your health care provider during your annual well check will depend on your age, overall health, lifestyle risk factors, and family history of disease. Counseling  Your health care provider may ask you questions about your: Alcohol use. Tobacco use. Drug use. Emotional well-being. Home and relationship well-being. Sexual activity. Eating habits. History of falls. Memory and ability to understand (cognition). Work and work Statistician. Screening  You may have the following tests or measurements: Height, weight, and BMI. Blood pressure. Lipid and cholesterol levels. These may be checked every 5 years, or more frequently if you are over 60 years old. Skin check. Lung cancer screening. You may have this screening every year starting at age 37 if you have a 30-pack-year history of smoking and currently smoke or have quit within the past 15 years. Fecal occult blood test (FOBT) of the stool. You may have this test every year starting at age 66. Flexible sigmoidoscopy or colonoscopy. You may have a sigmoidoscopy every 5 years or a colonoscopy every 10 years starting at age 42. Prostate cancer screening. Recommendations will vary depending on your family history and other risks. Hepatitis C blood test. Hepatitis B blood test. Sexually transmitted disease (STD) testing. Diabetes screening. This is done by checking your blood sugar (glucose) after you have not eaten for a while (fasting). You may have this done every 1-3 years. Abdominal aortic aneurysm (AAA) screening. You may need this if you are a current or former smoker. Osteoporosis. You may be screened starting at age 21 if you are at high risk. Talk with your health care provider about your test results, treatment options, and if necessary, the need for  more tests. Vaccines  Your health care provider may recommend certain vaccines, such as: Influenza vaccine. This is  recommended every year. Tetanus, diphtheria, and acellular pertussis (Tdap, Td) vaccine. You may need a Td booster every 10 years. Zoster vaccine. You may need this after age 81. Pneumococcal 13-valent conjugate (PCV13) vaccine. One dose is recommended after age 54. Pneumococcal polysaccharide (PPSV23) vaccine. One dose is recommended after age 68. Talk to your health care provider about which screenings and vaccines you need and how often you need them. This information is not intended to replace advice given to you by your health care provider. Make sure you discuss any questions you have with your health care provider. Document Released: 05/26/2015 Document Revised: 01/17/2016 Document Reviewed: 02/28/2015 Elsevier Interactive Patient Education  2017 [place] Prevention in the Home Falls can cause injuries. They can happen to people of all ages. There are many things you can do to make your home safe and to help prevent falls. What can I do on the outside of my home? Regularly fix the edges of walkways and driveways and fix any cracks. Remove anything that might make you trip as you walk through a door, such as a raised step or threshold. Trim any bushes or trees on the path to your home. Use bright outdoor lighting. Clear any walking paths of anything that might make someone trip, such as rocks or tools. Regularly check to see if handrails are loose or broken. Make sure that both sides of any steps have handrails. Any raised decks and porches should have guardrails on the edges. Have any leaves, snow, or ice cleared regularly. Use sand or salt on walking paths during winter. Clean up any spills in your garage right away. This includes oil or grease spills. What can I do in the bathroom? Use night lights. Install grab bars by the toilet and in the tub and shower. Do not use towel bars as grab bars. Use non-skid mats or decals in the tub or shower. If you need to sit down in  the shower, use a plastic, non-slip stool. Keep the floor dry. Clean up any water that spills on the floor as soon as it happens. Remove soap buildup in the tub or shower regularly. Attach bath mats securely with double-sided non-slip rug tape. Do not have throw rugs and other things on the floor that can make you trip. What can I do in the bedroom? Use night lights. Make sure that you have a light by your bed that is easy to reach. Do not use any sheets or blankets that are too big for your bed. They should not hang down onto the floor. Have a firm chair that has side arms. You can use this for support while you get dressed. Do not have throw rugs and other things on the floor that can make you trip. What can I do in the kitchen? Clean up any spills right away. Avoid walking on wet floors. Keep items that you use a lot in easy-to-reach places. If you need to reach something above you, use a strong step stool that has a grab bar. Keep electrical cords out of the way. Do not use floor polish or wax that makes floors slippery. If you must use wax, use non-skid floor wax. Do not have throw rugs and other things on the floor that can make you trip. What can I do with my stairs? Do not leave any items on the stairs. Make  sure that there are handrails on both sides of the stairs and use them. Fix handrails that are broken or loose. Make sure that handrails are as long as the stairways. Check any carpeting to make sure that it is firmly attached to the stairs. Fix any carpet that is loose or worn. Avoid having throw rugs at the top or bottom of the stairs. If you do have throw rugs, attach them to the floor with carpet tape. Make sure that you have a light switch at the top of the stairs and the bottom of the stairs. If you do not have them, ask someone to add them for you. What else can I do to help prevent falls? Wear shoes that: Do not have high heels. Have rubber bottoms. Are comfortable  and fit you well. Are closed at the toe. Do not wear sandals. If you use a stepladder: Make sure that it is fully opened. Do not climb a closed stepladder. Make sure that both sides of the stepladder are locked into place. Ask someone to hold it for you, if possible. Clearly mark and make sure that you can see: Any grab bars or handrails. First and last steps. Where the edge of each step is. Use tools that help you move around (mobility aids) if they are needed. These include: Canes. Walkers. Scooters. Crutches. Turn on the lights when you go into a dark area. Replace any light bulbs as soon as they burn out. Set up your furniture so you have a clear path. Avoid moving your furniture around. If any of your floors are uneven, fix them. If there are any pets around you, be aware of where they are. Review your medicines with your doctor. Some medicines can make you feel dizzy. This can increase your chance of falling. Ask your doctor what other things that you can do to help prevent falls. This information is not intended to replace advice given to you by your health care provider. Make sure you discuss any questions you have with your health care provider. Document Released: 02/23/2009 Document Revised: 10/05/2015 Document Reviewed: 06/03/2014 Elsevier Interactive Patient Education  2017 [employer].

## 2021-07-10 NOTE — Progress Notes (Signed)
Subjective:   Brent Suarez is a 76 y.o. male who presents for Medicare Annual/Subsequent preventive examination.  Review of Systems     Cardiac Risk Factors include: advanced age (>27men, >80 women);dyslipidemia;male gender;sedentary lifestyle;obesity (BMI >30kg/m2);Other (see comment), Risk factor comments: Seizure disorder     Objective:    Today's Vitals   07/10/21 1523  Weight: 199 lb 12.8 oz (90.6 kg)  Height: 5\' 8"  (1.727 m)   Body mass index is 30.38 kg/m.  Advanced Directives 07/10/2021 04/05/2017 04/05/2017 04/08/2016 02/17/2015 07/17/2012  Does Patient Have a Medical Advance Directive? No No No No No Patient does not have advance directive  Would patient like information on creating a medical advance directive? No - Patient declined No - Patient declined - No - Patient declined Yes - Educational materials given -  Pre-existing out of facility DNR order (yellow form or pink MOST form) - - - - - No    Current Medications (verified) Outpatient Encounter Medications as of 07/10/2021  Medication Sig   aspirin 81 MG tablet Take 81 mg by mouth every evening.    cholecalciferol (VITAMIN D) 1000 units tablet Take 1 tablet (1,000 Units total) by mouth at bedtime.   Omega-3 Fatty Acids (FISH OIL) 1000 MG CAPS Take 1 capsule by mouth at bedtime.   PHENObarbital (LUMINAL) 30 MG tablet Take 3 tablets (90 mg total) by mouth at bedtime.   phenytoin (DILANTIN) 100 MG ER capsule TAKE 3 CAPSULES(300 MG) BY MOUTH AT BEDTIME   polyethylene glycol powder (GLYCOLAX/MIRALAX) powder Take 17 g by mouth 2 (two) times daily.   simvastatin (ZOCOR) 20 MG tablet TAKE 1 TABLET(20 MG) BY MOUTH DAILY   tamsulosin (FLOMAX) 0.4 MG CAPS capsule Take one capsule by mouth every evening with food   vitamin C (ASCORBIC ACID) 500 MG tablet Take 500 mg by mouth at bedtime.   No facility-administered encounter medications on file as of 07/10/2021.    Allergies (verified) Patient has no known allergies.    History: Past Medical History:  Diagnosis Date   Epilepsy ([place])    hx   Glucose intolerance (impaired glucose tolerance)    Hyperlipidemia    taking medication    Hypertension    Prostate cancer ([place]) 10/10/11   bx=Adenocarcinoma,gleason=3+3=6,PSA=6.03,PSA on 08/18/2011=5.35   SBO (small bowel obstruction) ([place]) 10/19/210   Seizures ([place])    hx  last seizure 25 years ago   UTI (lower urinary tract infection)    hx   Past Surgical History:  Procedure Laterality Date   APPENDECTOMY     COLON SURGERY  10/08/06   right laproscopic partial colectomy,Dr. Aviva Signs   COLONOSCOPY N/A 02/17/2015   Procedure: COLONOSCOPY;  Surgeon: Daneil Dolin, MD;  Location: [facility];  Service: Endoscopy;  Laterality: N/A;  0930   COLONOSCOPY W/ BIOPSIES  2002,2004,,2008   numerous polypectomies and colonoscopies   COLONOSCOPY W/ BIOPSIES AND POLYPECTOMY  10/08/06   right terminal ileum,hemicolectomy,sessile tubovillous adenomatous polyp,no high grade dysplasia or malignancy   FRACTURE SURGERY     right arm fracture and repair   Family History  Problem Relation Age of Onset   Cancer Father        prostate   Cancer Maternal Uncle        unknown   Cancer Paternal Aunt        unknown origin   Colon cancer Neg Hx    Social History   Socioeconomic History   Marital status: Married  Spouse name: Not on file   Number of children: 2   Years of education: Not on file   Highest education level: Not on file  Occupational History    Employer: [employer]  Tobacco Use   Smoking status: Former    Packs/day: 1.00    Years: 25.00    Pack years: 25.00    Types: Cigarettes    Quit date: 05/13/1988    Years since quitting: 33.1   Smokeless tobacco: Never  Substance and Sexual Activity   Alcohol use: No    Alcohol/week: 0.0 standard drinks   Drug use: No   Sexual activity: Not Currently  Other Topics Concern   Not on file  Social History Narrative   Not on file   Social Determinants of  Health   Financial Resource Strain: Low Risk    Difficulty of Paying Living Expenses: Not hard at all  Food Insecurity: No Food Insecurity   Worried About Charity fundraiser in the Last Year: Never true   Ran Out of Food in the Last Year: Never true  Transportation Needs: No Transportation Needs   Lack of Transportation (Medical): No   Lack of Transportation (Non-Medical): No  Physical Activity: Insufficiently Active   Days of Exercise per Week: 3 days   Minutes of Exercise per Session: 30 min  Stress: No Stress Concern Present   Feeling of Stress : Not at all  Social Connections: Socially Integrated   Frequency of Communication with Friends and Family: More than three times a week   Frequency of Social Gatherings with Friends and Family: More than three times a week   Attends Religious Services: More than 4 times per year   Active Member of [employer] or Organizations: Yes   Attends Music therapist: More than 4 times per year   Marital Status: Married    Tobacco Counseling Counseling given: Not Answered   Clinical Intake:  Pre-visit preparation completed: Yes  Pain : No/denies pain     BMI - recorded: 30.38 Nutritional Status: BMI > 30  Obese Nutritional Risks: None Diabetes: No  How often do you need to have someone help you when you read instructions, pamphlets, or other written materials from your doctor or pharmacy?: 1 - Never  Diabetic?No  Interpreter Needed?: No  Information entered by :: mj Keisy Strickler, lpn   Activities of Daily Living In your present state of health, do you have any difficulty performing the following activities: 07/10/2021  Hearing? N  Vision? N  Difficulty concentrating or making decisions? N  Walking or climbing stairs? N  Dressing or bathing? N  Doing errands, shopping? N  Preparing Food and eating ? N  Using the Toilet? N  In the past six months, have you accidently leaked urine? N  Do you have problems with loss of bowel  control? N  Managing your Medications? N  Managing your Finances? N  Housekeeping or managing your Housekeeping? N  Some recent data might be hidden    Patient Care Team: Coral Spikes, DO as PCP - General (Family Medicine) Gala Romney Cristopher Estimable, MD as Consulting Physician (Gastroenterology)  Indicate any recent Medical Services you may have received from other than [facility] providers in the past year (date may be approximate).     Assessment:   This is a routine wellness examination for [place].  Hearing/Vision screen Hearing Screening - Comments:: No hearing issues. Vision Screening - Comments:: Glasses. 07/2021, Optometrist in [place].  Dietary issues and  exercise activities discussed: Current Exercise Habits: Home exercise routine, Type of exercise: walking, Time (Minutes): 30, Frequency (Times/Week): 3, Weekly Exercise (Minutes/Week): 90, Intensity: Mild, Exercise limited by: cardiac condition(s)   Goals Addressed             This Visit's Progress    Exercise 150 min/wk Moderate Activity       Try to exercise more.       Depression Screen PHQ 2/9 Scores 07/10/2021 12/25/2020 10/13/2019 09/09/2017 04/22/2016  PHQ - 2 Score 0 0 0 1 0    Fall Risk Fall Risk  07/10/2021 07/03/2021 12/25/2020 04/02/2019 09/09/2017  Falls in the past year? 0 0 0 0 No  Comment - - - Emmi Telephone Survey: data to providers prior to load -  Number falls in past yr: 0 0 0 - -  Injury with Fall? 0 0 0 - -  Risk for fall due to : No Fall Risks No Fall Risks No Fall Risks - -  Follow up Falls prevention discussed Falls evaluation completed Falls evaluation completed - -    FALL RISK PREVENTION PERTAINING TO THE HOME:  Any stairs in or around the home? Yes  If so, are there any without handrails? No  Home free of loose throw rugs in walkways, pet beds, electrical cords, etc? Yes  Adequate lighting in your home to reduce risk of falls? Yes   ASSISTIVE DEVICES UTILIZED TO PREVENT FALLS:  Life alert? No   Use of a cane, walker or w/c? No  Grab bars in the bathroom? Yes  Shower chair or bench in shower? Yes  Elevated toilet seat or a handicapped toilet? Yes   TIMED UP AND GO:  Was the test performed? Yes .  Length of time to ambulate 10 feet: 10 sec.   Gait steady and fast without use of assistive device  Cognitive Function:     6CIT Screen 07/10/2021  What Year? 0 points  What month? 0 points  What time? 0 points  Count back from 20 0 points  Months in reverse 0 points  Repeat phrase 0 points  Total Score 0    Immunizations Immunization History  Administered Date(s) Administered   Fluad Quad(high Dose 65+) 03/07/2021   Influenza,inj,Quad PF,6+ Mos 01/17/2016, 01/21/2017, 03/10/2018, 03/12/2019, 02/17/2020   Influenza-Unspecified 03/03/2013, 02/17/2014   Moderna Sars-Covid-2 Vaccination 06/29/2019, 07/27/2019   Pneumococcal Conjugate-13 11/01/2014   Pneumococcal Polysaccharide-23 03/10/2017   Zoster, Live 01/05/2013    TDAP status: Due, Education has been provided regarding the importance of this vaccine. Advised may receive this vaccine at local pharmacy or Health Dept. Aware to provide a copy of the vaccination record if obtained from local pharmacy or Health Dept. Verbalized acceptance and understanding.  Flu Vaccine status: Up to date  Pneumococcal vaccine status: Up to date  Covid-19 vaccine status: Completed vaccines  Qualifies for Shingles Vaccine? Yes   Zostavax completed Yes   Shingrix Completed?: No.    Education has been provided regarding the importance of this vaccine. Patient has been advised to call insurance company to determine out of pocket expense if they have not yet received this vaccine. Advised may also receive vaccine at local pharmacy or Health Dept. Verbalized acceptance and understanding.  Screening Tests Health Maintenance  Topic Date Due   Hepatitis C Screening  Never done   TETANUS/TDAP  Never done   Zoster Vaccines- Shingrix (1 of 2)  Never done   COVID-19 Vaccine (3 - Moderna risk series) 08/24/2019  COLONOSCOPY (Pts 45-22yrs Insurance coverage will need to be confirmed)  02/16/2025   Pneumonia Vaccine 77+ Years old  Completed   INFLUENZA VACCINE  Completed   HPV VACCINES  Aged Out    Health Maintenance  Health Maintenance Due  Topic Date Due   Hepatitis C Screening  Never done   TETANUS/TDAP  Never done   Zoster Vaccines- Shingrix (1 of 2) Never done   COVID-19 Vaccine (3 - Moderna risk series) 08/24/2019    Colorectal cancer screening: Type of screening: Colonoscopy. Completed 02/17/2015. Repeat every 10 years  Lung Cancer Screening: (Low Dose CT Chest recommended if Age 69-80 years, 30 pack-year currently smoking OR have quit w/in 15years.) does not qualify.    Additional Screening:  Hepatitis C Screening: does qualify; Completed Due  Vision Screening: Recommended annual ophthalmology exams for early detection of glaucoma and other disorders of the eye. Is the patient up to date with their annual eye exam?  No  Who is the provider or what is the name of the office in which the patient attends annual eye exams? Optometrist in [place], sees in March 2023. If pt is not established with a provider, would they like to be referred to a provider to establish care? No .   Dental Screening: Recommended annual dental exams for proper oral hygiene  Community Resource Referral / Chronic Care Management: CRR required this visit?  No   CCM required this visit?  No      Plan:     I have personally reviewed and noted the following in the patients chart:   Medical and social history Use of alcohol, tobacco or illicit drugs  Current medications and supplements including opioid prescriptions. Patient is not currently taking opioid prescriptions. Functional ability and status Nutritional status Physical activity Advanced directives List of other physicians Hospitalizations, surgeries, and ER visits in  previous 12 months Vitals Screenings to include cognitive, depression, and falls Referrals and appointments  In addition, I have reviewed and discussed with patient certain preventive protocols, quality metrics, and best practice recommendations. A written personalized care plan for preventive services as well as general preventive health recommendations were provided to patient.     Chriss Driver, LPN   11/06/348   Nurse Notes: Pt is up to date with health maintenance. Discussed Shingrix vaccine and how to obatin.

## 2021-07-22 DIAGNOSIS — Z20822 Contact with and (suspected) exposure to covid-19: Secondary | ICD-10-CM | POA: Diagnosis not present

## 2021-07-23 DIAGNOSIS — Z20822 Contact with and (suspected) exposure to covid-19: Secondary | ICD-10-CM | POA: Diagnosis not present

## 2021-07-26 DIAGNOSIS — Z20822 Contact with and (suspected) exposure to covid-19: Secondary | ICD-10-CM | POA: Diagnosis not present

## 2021-08-10 DIAGNOSIS — Z20822 Contact with and (suspected) exposure to covid-19: Secondary | ICD-10-CM | POA: Diagnosis not present

## 2021-08-18 DIAGNOSIS — Z20822 Contact with and (suspected) exposure to covid-19: Secondary | ICD-10-CM | POA: Diagnosis not present

## 2021-08-23 DIAGNOSIS — Z20822 Contact with and (suspected) exposure to covid-19: Secondary | ICD-10-CM | POA: Diagnosis not present

## 2021-08-24 DIAGNOSIS — Z20822 Contact with and (suspected) exposure to covid-19: Secondary | ICD-10-CM | POA: Diagnosis not present

## 2021-09-04 DIAGNOSIS — Z20822 Contact with and (suspected) exposure to covid-19: Secondary | ICD-10-CM | POA: Diagnosis not present

## 2021-09-05 DIAGNOSIS — Z20822 Contact with and (suspected) exposure to covid-19: Secondary | ICD-10-CM | POA: Diagnosis not present

## 2021-09-06 DIAGNOSIS — Z79899 Other long term (current) drug therapy: Secondary | ICD-10-CM | POA: Diagnosis not present

## 2021-09-06 DIAGNOSIS — Z8546 Personal history of malignant neoplasm of prostate: Secondary | ICD-10-CM | POA: Diagnosis not present

## 2021-09-06 DIAGNOSIS — E785 Hyperlipidemia, unspecified: Secondary | ICD-10-CM | POA: Diagnosis not present

## 2021-09-06 DIAGNOSIS — E559 Vitamin D deficiency, unspecified: Secondary | ICD-10-CM | POA: Diagnosis not present

## 2021-09-06 DIAGNOSIS — G40909 Epilepsy, unspecified, not intractable, without status epilepticus: Secondary | ICD-10-CM | POA: Diagnosis not present

## 2021-09-07 DIAGNOSIS — Z20822 Contact with and (suspected) exposure to covid-19: Secondary | ICD-10-CM | POA: Diagnosis not present

## 2021-09-07 LAB — CBC
Hematocrit: 39.9 % (ref 37.5–51.0)
Hemoglobin: 13.6 g/dL (ref 13.0–17.7)
MCH: 29.1 pg (ref 26.6–33.0)
MCHC: 34.1 g/dL (ref 31.5–35.7)
MCV: 85 fL (ref 79–97)
Platelets: 191 10*3/uL (ref 150–450)
RBC: 4.67 x10E6/uL (ref 4.14–5.80)
RDW: 13.1 % (ref 11.6–15.4)
WBC: 4.6 10*3/uL (ref 3.4–10.8)

## 2021-09-07 LAB — CMP14+EGFR
ALT: 14 IU/L (ref 0–44)
AST: 19 IU/L (ref 0–40)
Albumin/Globulin Ratio: 1.6 (ref 1.2–2.2)
Albumin: 4.2 g/dL (ref 3.7–4.7)
Alkaline Phosphatase: 193 IU/L — ABNORMAL HIGH (ref 44–121)
BUN/Creatinine Ratio: 17 (ref 10–24)
BUN: 13 mg/dL (ref 8–27)
Bilirubin Total: 0.2 mg/dL (ref 0.0–1.2)
CO2: 25 mmol/L (ref 20–29)
Calcium: 9.1 mg/dL (ref 8.6–10.2)
Chloride: 104 mmol/L (ref 96–106)
Creatinine, Ser: 0.76 mg/dL (ref 0.76–1.27)
Globulin, Total: 2.7 g/dL (ref 1.5–4.5)
Glucose: 98 mg/dL (ref 70–99)
Potassium: 4.7 mmol/L (ref 3.5–5.2)
Sodium: 141 mmol/L (ref 134–144)
Total Protein: 6.9 g/dL (ref 6.0–8.5)
eGFR: 94 mL/min/{1.73_m2} (ref >59)

## 2021-09-07 LAB — LIPID PANEL
Chol/HDL Ratio: 4.5 ratio (ref 0.0–5.0)
Cholesterol, Total: 158 mg/dL (ref 100–199)
HDL: 35 mg/dL — ABNORMAL LOW (ref >39)
LDL Chol Calc (NIH): 102 mg/dL — ABNORMAL HIGH (ref 0–99)
Triglycerides: 114 mg/dL (ref 0–149)
VLDL Cholesterol Cal: 21 mg/dL (ref 5–40)

## 2021-09-07 LAB — PSA: Prostate Specific Ag, Serum: 0.2 ng/mL (ref 0.0–4.0)

## 2021-09-07 LAB — VITAMIN D 25 HYDROXY (VIT D DEFICIENCY, FRACTURES): Vit D, 25-Hydroxy: 51.2 ng/mL (ref 30.0–100.0)

## 2021-09-07 LAB — PHENOBARBITAL LEVEL: Phenobarbital, Serum: 13 ug/mL — ABNORMAL LOW (ref 15–40)

## 2021-09-07 LAB — PHENYTOIN LEVEL, TOTAL: Phenytoin (Dilantin), Serum: 11.2 ug/mL (ref 10.0–20.0)

## 2021-09-08 ENCOUNTER — Other Ambulatory Visit: Payer: Self-pay | Admitting: Family Medicine

## 2021-09-08 DIAGNOSIS — Z20822 Contact with and (suspected) exposure to covid-19: Secondary | ICD-10-CM | POA: Diagnosis not present

## 2021-09-08 DIAGNOSIS — N400 Enlarged prostate without lower urinary tract symptoms: Secondary | ICD-10-CM

## 2021-09-08 DIAGNOSIS — E785 Hyperlipidemia, unspecified: Secondary | ICD-10-CM

## 2021-09-10 DIAGNOSIS — Z20822 Contact with and (suspected) exposure to covid-19: Secondary | ICD-10-CM | POA: Diagnosis not present

## 2021-09-11 DIAGNOSIS — Z20822 Contact with and (suspected) exposure to covid-19: Secondary | ICD-10-CM | POA: Diagnosis not present

## 2021-09-15 DIAGNOSIS — Z20828 Contact with and (suspected) exposure to other viral communicable diseases: Secondary | ICD-10-CM | POA: Diagnosis not present

## 2021-09-15 DIAGNOSIS — Z20822 Contact with and (suspected) exposure to covid-19: Secondary | ICD-10-CM | POA: Diagnosis not present

## 2021-09-17 DIAGNOSIS — Z20822 Contact with and (suspected) exposure to covid-19: Secondary | ICD-10-CM | POA: Diagnosis not present

## 2021-09-18 DIAGNOSIS — Z20822 Contact with and (suspected) exposure to covid-19: Secondary | ICD-10-CM | POA: Diagnosis not present

## 2021-09-18 DIAGNOSIS — Z20828 Contact with and (suspected) exposure to other viral communicable diseases: Secondary | ICD-10-CM | POA: Diagnosis not present

## 2021-09-18 DIAGNOSIS — Z23 Encounter for immunization: Secondary | ICD-10-CM | POA: Diagnosis not present

## 2021-09-19 ENCOUNTER — Other Ambulatory Visit: Payer: Self-pay | Admitting: Family Medicine

## 2021-09-19 DIAGNOSIS — G40909 Epilepsy, unspecified, not intractable, without status epilepticus: Secondary | ICD-10-CM

## 2021-09-20 DIAGNOSIS — Z20822 Contact with and (suspected) exposure to covid-19: Secondary | ICD-10-CM | POA: Diagnosis not present

## 2021-09-29 ENCOUNTER — Other Ambulatory Visit: Payer: Self-pay | Admitting: Nurse Practitioner

## 2021-09-29 DIAGNOSIS — G40909 Epilepsy, unspecified, not intractable, without status epilepticus: Secondary | ICD-10-CM

## 2021-12-07 ENCOUNTER — Other Ambulatory Visit: Payer: Self-pay | Admitting: *Deleted

## 2021-12-07 DIAGNOSIS — N400 Enlarged prostate without lower urinary tract symptoms: Secondary | ICD-10-CM

## 2021-12-07 MED ORDER — TAMSULOSIN HCL 0.4 MG PO CAPS: ORAL_CAPSULE | ORAL | 0 refills | Status: DC

## 2021-12-24 ENCOUNTER — Other Ambulatory Visit: Payer: Self-pay | Admitting: Family Medicine

## 2021-12-24 DIAGNOSIS — G40909 Epilepsy, unspecified, not intractable, without status epilepticus: Secondary | ICD-10-CM

## 2021-12-25 ENCOUNTER — Other Ambulatory Visit: Payer: Self-pay

## 2021-12-25 DIAGNOSIS — G40909 Epilepsy, unspecified, not intractable, without status epilepticus: Secondary | ICD-10-CM

## 2021-12-25 MED ORDER — PHENYTOIN SODIUM EXTENDED 100 MG PO CAPS: ORAL_CAPSULE | ORAL | 0 refills | Status: DC

## 2021-12-31 ENCOUNTER — Ambulatory Visit (INDEPENDENT_AMBULATORY_CARE_PROVIDER_SITE_OTHER): Payer: Medicare Other | Admitting: Family Medicine

## 2021-12-31 ENCOUNTER — Encounter: Payer: Self-pay | Admitting: Family Medicine

## 2021-12-31 DIAGNOSIS — G40909 Epilepsy, unspecified, not intractable, without status epilepticus: Secondary | ICD-10-CM

## 2021-12-31 DIAGNOSIS — E785 Hyperlipidemia, unspecified: Secondary | ICD-10-CM

## 2021-12-31 DIAGNOSIS — R748 Abnormal levels of other serum enzymes: Secondary | ICD-10-CM | POA: Diagnosis not present

## 2021-12-31 NOTE — Patient Instructions (Signed)
Go by the lab.  Follow up in 6 months.  Take care  Dr. Lacinda Axon

## 2022-01-01 LAB — GAMMA GT: GGT: 53 IU/L (ref 0–65)

## 2022-01-01 NOTE — Progress Notes (Signed)
Subjective:  Patient ID: Brent Suarez, male    DOB: Jul 19, 1945  Age: 76 y.o. MRN: [id number]  CC: Chief Complaint  Patient presents with   Seizures    HPI:  76 year old male with a remote history of seizure disorder (on long term/chronic phenytoin and phenobarbital), history of prostate cancer, history of colon cancer, hyperlipidemia, prediabetes presents for follow-up.  Patient states that he is doing well.  He has no complaints or concerns at this time.  Patient's most recent labs revealed persistently elevated alkaline phosphatase.  Needs GGT.  This is already been ordered.  Will discuss with the patient today.  Patient has been seizure-free for many years.  He remains on phenytoin and phenobarbital.  Phenobarbital levels have been low.  Have encouraged patient to see neurology but he has been reluctant to see neurology and/or make changes to his medication regimen.    Patient's hyperlipidemia is fairly well controlled.  LDL was slightly elevated on last check with an LDL of 102 he is tolerating simvastatin.   Patient Active Problem List   Diagnosis Date Noted   Prediabetes 09/09/2017   History of colon cancer 01/31/2015   Elevated alkaline phosphatase level 04/15/2013   Seizure disorder ([place]) 02/04/2013   Hyperlipidemia 02/04/2013   Prostate cancer ([place]) 10/10/2011    Social Hx   Social History   Socioeconomic History   Marital status: Married    Spouse name: Not on file   Number of children: 2   Years of education: Not on file   Highest education level: Not on file  Occupational History    Employer: [employer]  Tobacco Use   Smoking status: Former    Packs/day: 1.00    Years: 25.00    Total pack years: 25.00    Types: Cigarettes    Quit date: 05/13/1988    Years since quitting: 33.6   Smokeless tobacco: Never  Substance and Sexual Activity   Alcohol use: No    Alcohol/week: 0.0 standard drinks of alcohol   Drug use: No   Sexual activity: Not Currently   Other Topics Concern   Not on file  Social History Narrative   Not on file   Social Determinants of Health   Financial Resource Strain: Low Risk  (07/10/2021)   Overall Financial Resource Strain (CARDIA)    Difficulty of Paying Living Expenses: Not hard at all  Food Insecurity: No Food Insecurity (07/10/2021)   Hunger Vital Sign    Worried About Running Out of Food in the Last Year: Never true    Ran Out of Food in the Last Year: Never true  Transportation Needs: No Transportation Needs (07/10/2021)   PRAPARE - Hydrologist (Medical): No    Lack of Transportation (Non-Medical): No  Physical Activity: Insufficiently Active (07/10/2021)   Exercise Vital Sign    Days of Exercise per Week: 3 days    Minutes of Exercise per Session: 30 min  Stress: No Stress Concern Present (07/10/2021)   [place]    Feeling of Stress : Not at all  Social Connections: [place] (07/10/2021)   Social Connection and Isolation Panel [NHANES]    Frequency of Communication with Friends and Family: More than three times a week    Frequency of Social Gatherings with Friends and Family: More than three times a week    Attends Religious Services: More than 4 times per year  Active Member of Clubs or Organizations: Yes    Attends Archivist Meetings: More than 4 times per year    Marital Status: Married   ROS: No chest pain or SOB.  Objective:  BP 128/79   Pulse 100   Temp (!) 97.5 F (36.4 C)   Wt 201 lb 9.6 oz (91.4 kg)   SpO2 96%   BMI 30.65 kg/m      12/31/2021    2:02 PM 07/10/2021    3:23 PM 07/03/2021    1:18 PM  BP/Weight  Systolic BP 159  458  Diastolic BP 79  68  Wt. (Lbs) 201.6 199.8 196  BMI 30.65 kg/m2 30.38 kg/m2 29.8 kg/m2    Physical Exam Vitals and nursing note reviewed.  Constitutional:      General: He is not in acute distress.    Appearance: Normal  appearance.  HENT:     Head: Normocephalic and atraumatic.  Eyes:     General:        Right eye: No discharge.        Left eye: No discharge.     Conjunctiva/sclera: Conjunctivae normal.  Cardiovascular:     Rate and Rhythm: Normal rate and regular rhythm.  Pulmonary:     Effort: Pulmonary effort is normal.     Breath sounds: Normal breath sounds. No wheezing, rhonchi or rales.  Neurological:     General: No focal deficit present.     Mental Status: He is alert.  Psychiatric:        Mood and Affect: Mood normal.        Behavior: Behavior normal.     Lab Results  Component Value Date   WBC 4.6 09/06/2021   HGB 13.6 09/06/2021   HCT 39.9 09/06/2021   PLT 191 09/06/2021   GLUCOSE 98 09/06/2021   CHOL 158 09/06/2021   TRIG 114 09/06/2021   HDL 35 (L) 09/06/2021   LDLCALC 102 (H) 09/06/2021   ALT 14 09/06/2021   AST 19 09/06/2021   NA 141 09/06/2021   K 4.7 09/06/2021   CL 104 09/06/2021   CREATININE 0.76 09/06/2021   BUN 13 09/06/2021   CO2 25 09/06/2021   INR 1.03 02/14/2009     Assessment & Plan:   Problem List Items Addressed This Visit       Nervous and Auditory   Seizure disorder ([place])    Continue patient medication.  Patient has been reluctant to see neurology.        Other   Elevated alkaline phosphatase level    GGT for further evaluation.      Hyperlipidemia    Stable.  Continue simvastatin.      Follow-up: 6 months  [place]

## 2022-01-01 NOTE — Assessment & Plan Note (Signed)
Continue patient medication.  Patient has been reluctant to see neurology.

## 2022-01-01 NOTE — Assessment & Plan Note (Signed)
Stable.  Continue simvastatin. 

## 2022-01-01 NOTE — Assessment & Plan Note (Signed)
GGT for further evaluation.

## 2022-01-07 ENCOUNTER — Other Ambulatory Visit: Payer: Self-pay

## 2022-01-07 DIAGNOSIS — R748 Abnormal levels of other serum enzymes: Secondary | ICD-10-CM

## 2022-01-07 NOTE — Progress Notes (Signed)
Message left with ms Julien Girt regarding referral recommendations.

## 2022-01-25 ENCOUNTER — Other Ambulatory Visit: Payer: Self-pay | Admitting: Family Medicine

## 2022-01-25 DIAGNOSIS — G40909 Epilepsy, unspecified, not intractable, without status epilepticus: Secondary | ICD-10-CM

## 2022-02-25 ENCOUNTER — Other Ambulatory Visit: Payer: Self-pay | Admitting: Family Medicine

## 2022-02-25 DIAGNOSIS — G40909 Epilepsy, unspecified, not intractable, without status epilepticus: Secondary | ICD-10-CM

## 2022-03-07 ENCOUNTER — Other Ambulatory Visit: Payer: Self-pay | Admitting: Family Medicine

## 2022-03-07 DIAGNOSIS — E785 Hyperlipidemia, unspecified: Secondary | ICD-10-CM

## 2022-03-07 DIAGNOSIS — N400 Enlarged prostate without lower urinary tract symptoms: Secondary | ICD-10-CM

## 2022-03-21 ENCOUNTER — Other Ambulatory Visit: Payer: Self-pay | Admitting: Family Medicine

## 2022-03-21 DIAGNOSIS — E785 Hyperlipidemia, unspecified: Secondary | ICD-10-CM

## 2022-03-23 ENCOUNTER — Other Ambulatory Visit: Payer: Self-pay | Admitting: Family Medicine

## 2022-03-23 DIAGNOSIS — G40909 Epilepsy, unspecified, not intractable, without status epilepticus: Secondary | ICD-10-CM

## 2022-03-25 ENCOUNTER — Other Ambulatory Visit: Payer: Self-pay

## 2022-03-25 DIAGNOSIS — G40909 Epilepsy, unspecified, not intractable, without status epilepticus: Secondary | ICD-10-CM

## 2022-03-25 MED ORDER — PHENOBARBITAL 30 MG PO TABS: 90.0000 mg | ORAL_TABLET | Freq: Every day | ORAL | 1 refills | Status: DC

## 2022-03-27 ENCOUNTER — Other Ambulatory Visit: Payer: Self-pay | Admitting: Family Medicine

## 2022-03-27 DIAGNOSIS — G40909 Epilepsy, unspecified, not intractable, without status epilepticus: Secondary | ICD-10-CM

## 2022-04-01 ENCOUNTER — Other Ambulatory Visit: Payer: Self-pay | Admitting: *Deleted

## 2022-04-01 DIAGNOSIS — N400 Enlarged prostate without lower urinary tract symptoms: Secondary | ICD-10-CM

## 2022-04-01 MED ORDER — TAMSULOSIN HCL 0.4 MG PO CAPS: ORAL_CAPSULE | ORAL | 0 refills | Status: DC

## 2022-05-29 DIAGNOSIS — R252 Cramp and spasm: Secondary | ICD-10-CM | POA: Diagnosis not present

## 2022-05-29 DIAGNOSIS — I83891 Varicose veins of right lower extremities with other complications: Secondary | ICD-10-CM | POA: Diagnosis not present

## 2022-05-29 DIAGNOSIS — M79661 Pain in right lower leg: Secondary | ICD-10-CM | POA: Diagnosis not present

## 2022-05-29 DIAGNOSIS — M79604 Pain in right leg: Secondary | ICD-10-CM | POA: Diagnosis not present

## 2022-06-24 ENCOUNTER — Other Ambulatory Visit: Payer: Self-pay | Admitting: Family Medicine

## 2022-06-24 DIAGNOSIS — G40909 Epilepsy, unspecified, not intractable, without status epilepticus: Secondary | ICD-10-CM

## 2022-06-24 DIAGNOSIS — I83891 Varicose veins of right lower extremities with other complications: Secondary | ICD-10-CM | POA: Diagnosis not present

## 2022-07-08 DIAGNOSIS — I83891 Varicose veins of right lower extremities with other complications: Secondary | ICD-10-CM | POA: Diagnosis not present

## 2022-07-19 NOTE — Progress Notes (Deleted)
Subjective:   Brent Suarez is a 77 y.o. male who presents for Medicare Annual/Subsequent preventive examination.  Review of Systems    ***       Objective:    There were no vitals filed for this visit. There is no height or weight on file to calculate BMI.     07/10/2021    3:33 PM 04/05/2017   11:32 PM 04/05/2017    7:16 PM 04/08/2016    1:39 PM 02/17/2015    8:36 AM 07/17/2012    6:09 PM  Advanced Directives  Does Patient Have a Medical Advance Directive? No No No No No Patient does not have advance directive  Would patient like information on creating a medical advance directive? No - Patient declined No - Patient declined  No - Patient declined Yes - Educational materials given   Pre-existing out of facility DNR order (yellow form or pink MOST form)      No    Current Medications (verified) Outpatient Encounter Medications as of 07/19/2022  Medication Sig   aspirin 81 MG tablet Take 81 mg by mouth every evening.    cholecalciferol (VITAMIN D) 1000 units tablet Take 1 tablet (1,000 Units total) by mouth at bedtime.   Omega-3 Fatty Acids (FISH OIL) 1000 MG CAPS Take 1 capsule by mouth at bedtime.   PHENObarbital (LUMINAL) 30 MG tablet TAKE 3 TABLETS BY MOUTH AT BEDTIME   phenytoin (DILANTIN) 100 MG ER capsule TAKE 3 CAPSULES(300 MG) BY MOUTH AT BEDTIME   polyethylene glycol powder (GLYCOLAX/MIRALAX) powder Take 17 g by mouth 2 (two) times daily.   simvastatin (ZOCOR) 20 MG tablet TAKE 1 TABLET(20 MG) BY MOUTH DAILY   tamsulosin (FLOMAX) 0.4 MG CAPS capsule TAKE 1 CAPSULE BY MOUTH EVERY EVENING WITH FOOD   vitamin C (ASCORBIC ACID) 500 MG tablet Take 500 mg by mouth at bedtime.   No facility-administered encounter medications on file as of 07/19/2022.    Allergies (verified) Patient has no known allergies.   History: Past Medical History:  Diagnosis Date   Epilepsy ([place])    hx   Glucose intolerance (impaired glucose tolerance)    Hyperlipidemia    taking medication     Hypertension    Prostate cancer ([place]) 10/10/11   bx=Adenocarcinoma,gleason=3+3=6,PSA=6.03,PSA on 08/18/2011=5.35   SBO (small bowel obstruction) ([place]) 10/19/210   Seizures ([place])    hx  last seizure 25 years ago   UTI (lower urinary tract infection)    hx   Past Surgical History:  Procedure Laterality Date   APPENDECTOMY     COLON SURGERY  10/08/06   right laproscopic partial colectomy,Dr. Aviva Signs   COLONOSCOPY N/A 02/17/2015   Procedure: COLONOSCOPY;  Surgeon: Daneil Dolin, MD;  Location: [facility];  Service: Endoscopy;  Laterality: N/A;  0930   COLONOSCOPY W/ BIOPSIES  2002,2004,,2008   numerous polypectomies and colonoscopies   COLONOSCOPY W/ BIOPSIES AND POLYPECTOMY  10/08/06   right terminal ileum,hemicolectomy,sessile tubovillous adenomatous polyp,no high grade dysplasia or malignancy   FRACTURE SURGERY     right arm fracture and repair   Family History  Problem Relation Age of Onset   Cancer Father        prostate   Cancer Maternal Uncle        unknown   Cancer Paternal Aunt        unknown origin   Colon cancer Neg Hx    Social History   Socioeconomic History   Marital status: Married  Spouse name: Not on file   Number of children: 2   Years of education: Not on file   Highest education level: Not on file  Occupational History    Employer: [employer]  Tobacco Use   Smoking status: Former    Packs/day: 1.00    Years: 25.00    Total pack years: 25.00    Types: Cigarettes    Quit date: 05/13/1988    Years since quitting: 34.2   Smokeless tobacco: Never  Substance and Sexual Activity   Alcohol use: No    Alcohol/week: 0.0 standard drinks of alcohol   Drug use: No   Sexual activity: Not Currently  Other Topics Concern   Not on file  Social History Narrative   Not on file   Social Determinants of Health   Financial Resource Strain: Low Risk  (07/10/2021)   Overall Financial Resource Strain (CARDIA)    Difficulty of Paying Living Expenses: Not  hard at all  Food Insecurity: No Food Insecurity (07/10/2021)   Hunger Vital Sign    Worried About Running Out of Food in the Last Year: Never true    Ran Out of Food in the Last Year: Never true  Transportation Needs: No Transportation Needs (07/10/2021)   PRAPARE - Hydrologist (Medical): No    Lack of Transportation (Non-Medical): No  Physical Activity: Insufficiently Active (07/10/2021)   Exercise Vital Sign    Days of Exercise per Week: 3 days    Minutes of Exercise per Session: 30 min  Stress: No Stress Concern Present (07/10/2021)   [place]    Feeling of Stress : Not at all  Social Connections: [place] (07/10/2021)   Social Connection and Isolation Panel [NHANES]    Frequency of Communication with Friends and Family: More than three times a week    Frequency of Social Gatherings with Friends and Family: More than three times a week    Attends Religious Services: More than 4 times per year    Active Member of [employer] or Organizations: Yes    Attends Music therapist: More than 4 times per year    Marital Status: Married    Tobacco Counseling Counseling given: Not Answered   Clinical Intake:                 Diabetic?No          Activities of Daily Living     No data to display          Patient Care Team: Coral Spikes, DO as PCP - General (Family Medicine) Gala Romney, Cristopher Estimable, MD as Consulting Physician (Gastroenterology)  Indicate any recent Medical Services you may have received from other than [facility] providers in the past year (date may be approximate).     Assessment:   This is a routine wellness examination for [name]  Hearing/Vision screen No results found.  Dietary issues and exercise activities discussed:     Goals Addressed   None    Depression Screen    07/10/2021    3:27 PM 12/25/2020    1:22 PM 10/13/2019    1:14 PM  09/09/2017    8:16 AM 04/22/2016    6:19 PM  PHQ 2/9 Scores  PHQ - 2 Score 0 0 0 1 0    Fall Risk    07/10/2021    3:33 PM 07/03/2021    1:21 PM 12/25/2020  1:21 PM 04/02/2019    5:11 PM 09/09/2017    8:16 AM  Fall Risk   Falls in the past year? 0 0 0 0 No  Comment    Emmi Telephone Survey: data to providers prior to load   Number falls in past yr: 0 0 0    Injury with Fall? 0 0 0    Risk for fall due to : No Fall Risks No Fall Risks No Fall Risks    Follow up Falls prevention discussed Falls evaluation completed Falls evaluation completed      [place]:  Any stairs in or around the home? {YES/NO:21197} If so, are there any without handrails? {YES/NO:21197} Home free of loose throw rugs in walkways, pet beds, electrical cords, etc? {YES/NO:21197} Adequate lighting in your home to reduce risk of falls? {YES/NO:21197}  ASSISTIVE DEVICES UTILIZED TO PREVENT FALLS:  Life alert? {YES/NO:21197} Use of a cane, walker or w/c? {YES/NO:21197} Grab bars in the bathroom? {YES/NO:21197} Shower chair or bench in shower? {YES/NO:21197} Elevated toilet seat or a handicapped toilet? {YES/NO:21197}  TIMED UP AND GO:  Was the test performed? {YES/NO:21197}.  Length of time to ambulate 10 feet: *** sec.   {Appearance of YN:[id number]  Cognitive Function:        07/10/2021    3:41 PM  6CIT Screen  What Year? 0 points  What month? 0 points  What time? 0 points  Count back from 20 0 points  Months in reverse 0 points  Repeat phrase 0 points  Total Score 0 points    Immunizations Immunization History  Administered Date(s) Administered   Fluad Quad(high Dose 65+) 03/07/2021   Influenza,inj,Quad PF,6+ Mos 01/17/2016, 01/21/2017, 03/10/2018, 03/12/2019, 02/17/2020   Influenza-Unspecified 03/03/2013, 02/17/2014   Moderna Sars-Covid-2 Vaccination 06/29/2019, 07/27/2019   Pneumococcal Conjugate-13 11/01/2014   Pneumococcal Polysaccharide-23  03/10/2017   Zoster, Live 01/05/2013    TDAP status: Due, Education has been provided regarding the importance of this vaccine. Advised may receive this vaccine at local pharmacy or Health Dept. Aware to provide a copy of the vaccination record if obtained from local pharmacy or Health Dept. Verbalized acceptance and understanding.  {Flu Vaccine status:2101806}  Pneumococcal vaccine status: Up to date  Covid-19 vaccine status: Information provided on how to obtain vaccines.   Qualifies for Shingles Vaccine? Yes   Zostavax completed Yes   Shingrix Completed?: No.    Education has been provided regarding the importance of this vaccine. Patient has been advised to call insurance company to determine out of pocket expense if they have not yet received this vaccine. Advised may also receive vaccine at local pharmacy or Health Dept. Verbalized acceptance and understanding.  Screening Tests Health Maintenance  Topic Date Due   DTaP/Tdap/Td (1 - Tdap) Never done   Zoster Vaccines- Shingrix (1 of 2) Never done   COVID-19 Vaccine (3 - Moderna risk series) 08/24/2019   INFLUENZA VACCINE  12/11/2021   Medicare Annual Wellness (AWV)  07/10/2022   COLONOSCOPY (Pts 45-39yr Insurance coverage will need to be confirmed)  02/16/2025   Pneumonia Vaccine 77 Years old  Completed   HPV VACCINES  Aged Out   Hepatitis C Screening  Discontinued    Health Maintenance  Health Maintenance Due  Topic Date Due   DTaP/Tdap/Td (1 - Tdap) Never done   Zoster Vaccines- Shingrix (1 of 2) Never done   COVID-19 Vaccine (3 - Moderna risk series) 08/24/2019   INFLUENZA VACCINE  12/11/2021  Medicare Annual Wellness (AWV)  07/10/2022    Colorectal cancer screening: Type of screening: Colonoscopy. Completed 02/17/15. Repeat every 10 years  Lung Cancer Screening: (Low Dose CT Chest recommended if Age 88-80 years, 30 pack-year currently smoking OR have quit w/in 15years.) does not qualify.   Lung Cancer Screening  Referral: n/a  Additional Screening:  Hepatitis C Screening: does not qualify;  Vision Screening: Recommended annual ophthalmology exams for early detection of glaucoma and other disorders of the eye. Is the patient up to date with their annual eye exam?  {YES/NO:21197} Who is the provider or what is the name of the office in which the patient attends annual eye exams? *** If pt is not established with a provider, would they like to be referred to a provider to establish care? {YES/NO:21197}.   Dental Screening: Recommended annual dental exams for proper oral hygiene  Community Resource Referral / Chronic Care Management: CRR required this visit?  {YES/NO:21197}  CCM required this visit?  {YES/NO:21197}     Plan:     I have personally reviewed and noted the following in the patient's chart:   Medical and social history Use of alcohol, tobacco or illicit drugs  Current medications and supplements including opioid prescriptions. {Opioid Prescriptions:[phone]} Functional ability and status Nutritional status Physical activity Advanced directives List of other physicians Hospitalizations, surgeries, and ER visits in previous 12 months Vitals Screenings to include cognitive, depression, and falls Referrals and appointments  In addition, I have reviewed and discussed with patient certain preventive protocols, quality metrics, and best practice recommendations. A written personalized care plan for preventive services as well as general preventive health recommendations were provided to patient.     Denman [place], [place]   [id number]   Nurse Notes: ***

## 2022-07-25 ENCOUNTER — Telehealth: Payer: Self-pay | Admitting: Family Medicine

## 2022-07-25 DIAGNOSIS — M79604 Pain in right leg: Secondary | ICD-10-CM | POA: Diagnosis not present

## 2022-07-25 DIAGNOSIS — I83891 Varicose veins of right lower extremities with other complications: Secondary | ICD-10-CM | POA: Diagnosis not present

## 2022-07-25 NOTE — Telephone Encounter (Signed)
Called patient to schedule Medicare Annual Wellness Visit (AWV). No voicemail available to leave a message.  Last date of AWV: 07/10/2021   Please schedule an appointment at any time with Loma Sousa, [facility] .  If any questions, please contact me at [phone].  Thank you,  Colletta [place],  [place] Program Direct Dial ??[phone]

## 2022-08-08 DIAGNOSIS — I83811 Varicose veins of right lower extremities with pain: Secondary | ICD-10-CM | POA: Diagnosis not present

## 2022-08-08 DIAGNOSIS — M7989 Other specified soft tissue disorders: Secondary | ICD-10-CM | POA: Diagnosis not present

## 2022-08-14 DIAGNOSIS — M7989 Other specified soft tissue disorders: Secondary | ICD-10-CM | POA: Diagnosis not present

## 2022-08-14 DIAGNOSIS — I872 Venous insufficiency (chronic) (peripheral): Secondary | ICD-10-CM | POA: Diagnosis not present

## 2022-08-14 DIAGNOSIS — I83811 Varicose veins of right lower extremities with pain: Secondary | ICD-10-CM | POA: Diagnosis not present

## 2022-09-14 ENCOUNTER — Other Ambulatory Visit: Payer: Self-pay | Admitting: Family Medicine

## 2022-09-14 DIAGNOSIS — E785 Hyperlipidemia, unspecified: Secondary | ICD-10-CM

## 2022-09-17 ENCOUNTER — Ambulatory Visit (INDEPENDENT_AMBULATORY_CARE_PROVIDER_SITE_OTHER): Payer: Medicare Other | Admitting: Family Medicine

## 2022-09-17 VITALS — BP 109/67 | HR 78 | Temp 97.2°F | Ht 68.0 in | Wt 208.0 lb

## 2022-09-17 DIAGNOSIS — E559 Vitamin D deficiency, unspecified: Secondary | ICD-10-CM | POA: Diagnosis not present

## 2022-09-17 DIAGNOSIS — Z79899 Other long term (current) drug therapy: Secondary | ICD-10-CM

## 2022-09-17 DIAGNOSIS — N400 Enlarged prostate without lower urinary tract symptoms: Secondary | ICD-10-CM

## 2022-09-17 DIAGNOSIS — R748 Abnormal levels of other serum enzymes: Secondary | ICD-10-CM | POA: Diagnosis not present

## 2022-09-17 DIAGNOSIS — G40909 Epilepsy, unspecified, not intractable, without status epilepticus: Secondary | ICD-10-CM

## 2022-09-17 DIAGNOSIS — Z0001 Encounter for general adult medical examination with abnormal findings: Secondary | ICD-10-CM

## 2022-09-17 DIAGNOSIS — E785 Hyperlipidemia, unspecified: Secondary | ICD-10-CM | POA: Diagnosis not present

## 2022-09-17 DIAGNOSIS — Z0000 Encounter for general adult medical examination without abnormal findings: Secondary | ICD-10-CM

## 2022-09-17 MED ORDER — TAMSULOSIN HCL 0.4 MG PO CAPS: ORAL_CAPSULE | ORAL | 0 refills | Status: DC

## 2022-09-17 NOTE — Patient Instructions (Signed)
Labs today.  Continue your medications.  Consider shingles vacccine and tetatnus vaccine (pharmacy).  Follow up in 6 months.

## 2022-09-18 DIAGNOSIS — Z0000 Encounter for general adult medical examination without abnormal findings: Secondary | ICD-10-CM | POA: Insufficient documentation

## 2022-09-18 LAB — VITAMIN D 25 HYDROXY (VIT D DEFICIENCY, FRACTURES): Vit D, 25-Hydroxy: 31.7 ng/mL (ref 30.0–100.0)

## 2022-09-18 NOTE — Progress Notes (Signed)
Subjective:  Patient ID: Brent Suarez, male    DOB: 1945-08-17  Age: 77 y.o. MRN: [id number]  CC: Chief Complaint  Patient presents with   Annual Exam    HPI:  77 year old male presents for an annual physical exam.   Patient reports that he is overall doing well.  Patient is due for tetanus and is also a candidate for shingles vaccine.  I advised him that he can get these at his pharmacy.  Patient has remote history of colon cancer.  He is currently 77 years of age.  He is inquiring about whether he needs an additional colonoscopy.  I advised him that he should consider this.  He states that he will consider and let me know.  Patient needs labs today.  Patient Active Problem List   Diagnosis Date Noted   Annual physical exam 09/18/2022   Prediabetes 09/09/2017   History of colon cancer 01/31/2015   Elevated alkaline phosphatase level 04/15/2013   Seizure disorder (HCC) 02/04/2013   Hyperlipidemia 02/04/2013   Prostate cancer (HCC) 10/10/2011    Social Hx   Social History   Socioeconomic History   Marital status: Married    Spouse name: Not on file   Number of children: 2   Years of education: Not on file   Highest education level: Not on file  Occupational History    Employer: [employer]  Tobacco Use   Smoking status: Former    Packs/day: 1.00    Years: 25.00    Additional pack years: 0.00    Total pack years: 25.00    Types: Cigarettes    Quit date: 05/13/1988    Years since quitting: 34.3   Smokeless tobacco: Never  Substance and Sexual Activity   Alcohol use: No    Alcohol/week: 0.0 standard drinks of alcohol   Drug use: No   Sexual activity: Not Currently  Other Topics Concern   Not on file  Social History Narrative   Not on file   Social Determinants of Health   Financial Resource Strain: Low Risk  (07/10/2021)   Overall Financial Resource Strain (CARDIA)    Difficulty of Paying Living Expenses: Not hard at all  Food Insecurity: No Food  Insecurity (07/10/2021)   Hunger Vital Sign    Worried About Running Out of Food in the Last Year: Never true    Ran Out of Food in the Last Year: Never true  Transportation Needs: No Transportation Needs (07/10/2021)   PRAPARE - Administrator, Civil Service (Medical): No    Lack of Transportation (Non-Medical): No  Physical Activity: Insufficiently Active (07/10/2021)   Exercise Vital Sign    Days of Exercise per Week: 3 days    Minutes of Exercise per Session: 30 min  Stress: No Stress Concern Present (07/10/2021)   [employer] of Occupational Health - Occupational Stress Questionnaire    Feeling of Stress : Not at all  Social Connections: Socially Integrated (07/10/2021)   Social Connection and Isolation Panel [NHANES]    Frequency of Communication with Friends and Family: More than three times a week    Frequency of Social Gatherings with Friends and Family: More than three times a week    Attends Religious Services: More than 4 times per year    Active Member of [employer] or Organizations: Yes    Attends Engineer, structural: More than 4 times per year    Marital Status: Married    Review  of Systems  Constitutional: Negative.   Respiratory: Negative.    Cardiovascular: Negative.    Objective:  BP 109/67   Pulse 78   Temp (!) 97.2 F (36.2 C)   Ht 5\' 8"  (1.727 m)   Wt 208 lb (94.3 kg)   SpO2 95%   BMI 31.63 kg/m      09/17/2022    1:16 PM 12/31/2021    2:02 PM 07/10/2021    3:23 PM  BP/Weight  Systolic BP 109 128   Diastolic BP 67 79   Wt. (Lbs) 208 201.6 199.8  BMI 31.63 kg/m2 30.65 kg/m2 30.38 kg/m2    Physical Exam Vitals and nursing note reviewed.  Constitutional:      General: He is not in acute distress.    Appearance: Normal appearance.  HENT:     Head: Normocephalic and atraumatic.  Eyes:     General:        Right eye: No discharge.        Left eye: No discharge.     Conjunctiva/sclera: Conjunctivae normal.  Cardiovascular:      Rate and Rhythm: Normal rate and regular rhythm.  Pulmonary:     Effort: Pulmonary effort is normal.     Breath sounds: Normal breath sounds. No wheezing, rhonchi or rales.  Neurological:     Mental Status: He is alert.  Psychiatric:        Mood and Affect: Mood normal.        Behavior: Behavior normal.     Lab Results  Component Value Date   WBC 5.9 09/17/2022   HGB 13.3 09/17/2022   HCT 39.4 09/17/2022   PLT 181 09/17/2022   GLUCOSE 85 09/17/2022   CHOL 137 09/17/2022   TRIG 215 (H) 09/17/2022   HDL 31 (L) 09/17/2022   LDLCALC 70 09/17/2022   ALT 13 09/17/2022   AST 21 09/17/2022   NA 141 09/17/2022   K 4.3 09/17/2022   CL 104 09/17/2022   CREATININE 0.75 (L) 09/17/2022   BUN 14 09/17/2022   CO2 24 09/17/2022   INR 1.03 02/14/2009     Assessment & Plan:   Problem List Items Addressed This Visit       Nervous and Auditory   Seizure disorder (HCC)   Relevant Orders   Phenobarbital level (Completed)   Phenytoin level, free and total (Completed)     Other   Elevated alkaline phosphatase level   Relevant Orders   CMP14+EGFR (Completed)   Hyperlipidemia   Relevant Orders   Lipid panel (Completed)   Annual physical exam - Primary    Doing well. Labs today. Advised to consider getting tetanus and shingles vaccine at the pharmacy. Consider colonoscopy.       Other Visit Diagnoses     Prostate hypertrophy       Relevant Medications   tamsulosin (FLOMAX) 0.4 MG CAPS capsule   High risk medication use       Relevant Orders   CBC (Completed)   Vitamin D, 25-hydroxy (Completed)       Meds ordered this encounter  Medications   tamsulosin (FLOMAX) 0.4 MG CAPS capsule    Sig: TAKE 1 CAPSULE BY MOUTH EVERY EVENING WITH FOOD    Dispense:  90 capsule    Refill:  0    Follow-up:  Return in about 6 months (around 03/20/2023).  Everlene Other DO [facility]

## 2022-09-18 NOTE — Assessment & Plan Note (Signed)
Doing well. Labs today. Advised to consider getting tetanus and shingles vaccine at the pharmacy. Consider colonoscopy.

## 2022-09-20 LAB — CBC
Hematocrit: 39.4 % (ref 37.5–51.0)
Hemoglobin: 13.3 g/dL (ref 13.0–17.7)
MCH: 29.2 pg (ref 26.6–33.0)
MCHC: 33.8 g/dL (ref 31.5–35.7)
MCV: 86 fL (ref 79–97)
Platelets: 181 10*3/uL (ref 150–450)
RBC: 4.56 x10E6/uL (ref 4.14–5.80)
RDW: 13.6 % (ref 11.6–15.4)
WBC: 5.9 10*3/uL (ref 3.4–10.8)

## 2022-09-20 LAB — LIPID PANEL
Chol/HDL Ratio: 4.4 ratio (ref 0.0–5.0)
Cholesterol, Total: 137 mg/dL (ref 100–199)
HDL: 31 mg/dL — ABNORMAL LOW (ref >39)
LDL Chol Calc (NIH): 70 mg/dL (ref 0–99)
Triglycerides: 215 mg/dL — ABNORMAL HIGH (ref 0–149)
VLDL Cholesterol Cal: 36 mg/dL (ref 5–40)

## 2022-09-20 LAB — CMP14+EGFR
ALT: 13 IU/L (ref 0–44)
AST: 21 IU/L (ref 0–40)
Albumin/Globulin Ratio: 1.6 (ref 1.2–2.2)
Albumin: 4.4 g/dL (ref 3.8–4.8)
Alkaline Phosphatase: 221 IU/L — ABNORMAL HIGH (ref 44–121)
BUN/Creatinine Ratio: 19 (ref 10–24)
BUN: 14 mg/dL (ref 8–27)
Bilirubin Total: <0.2 mg/dL (ref 0.0–1.2)
CO2: 24 mmol/L (ref 20–29)
Calcium: 8.8 mg/dL (ref 8.6–10.2)
Chloride: 104 mmol/L (ref 96–106)
Creatinine, Ser: 0.75 mg/dL — ABNORMAL LOW (ref 0.76–1.27)
Globulin, Total: 2.7 g/dL (ref 1.5–4.5)
Glucose: 85 mg/dL (ref 70–99)
Potassium: 4.3 mmol/L (ref 3.5–5.2)
Sodium: 141 mmol/L (ref 134–144)
Total Protein: 7.1 g/dL (ref 6.0–8.5)
eGFR: 94 mL/min/{1.73_m2} (ref >59)

## 2022-09-20 LAB — PHENYTOIN LEVEL, FREE AND TOTAL
Phenytoin, Free: 0.7 ug/mL — ABNORMAL LOW (ref 1.0–2.0)
Phenytoin, Serum: 10.9 ug/mL (ref 10.0–20.0)

## 2022-09-20 LAB — PHENOBARBITAL LEVEL: Phenobarbital, Serum: 13 ug/mL — ABNORMAL LOW (ref 15–40)

## 2022-09-23 ENCOUNTER — Encounter: Payer: Self-pay | Admitting: *Deleted

## 2023-01-09 ENCOUNTER — Other Ambulatory Visit: Payer: Self-pay | Admitting: Family Medicine

## 2023-01-09 DIAGNOSIS — G40909 Epilepsy, unspecified, not intractable, without status epilepticus: Secondary | ICD-10-CM

## 2023-01-14 ENCOUNTER — Other Ambulatory Visit: Payer: Self-pay

## 2023-01-14 DIAGNOSIS — G40909 Epilepsy, unspecified, not intractable, without status epilepticus: Secondary | ICD-10-CM

## 2023-01-15 MED ORDER — PHENOBARBITAL 30 MG PO TABS
90.0000 mg | ORAL_TABLET | Freq: Every day | ORAL | 3 refills | Status: DC
Start: 2023-01-15 — End: 2023-07-17

## 2023-03-07 ENCOUNTER — Other Ambulatory Visit: Payer: Self-pay | Admitting: Family Medicine

## 2023-03-07 DIAGNOSIS — N400 Enlarged prostate without lower urinary tract symptoms: Secondary | ICD-10-CM

## 2023-03-20 ENCOUNTER — Ambulatory Visit: Payer: Medicare Other | Admitting: Family Medicine

## 2023-03-20 DIAGNOSIS — G40909 Epilepsy, unspecified, not intractable, without status epilepticus: Secondary | ICD-10-CM

## 2023-03-20 DIAGNOSIS — E785 Hyperlipidemia, unspecified: Secondary | ICD-10-CM | POA: Diagnosis not present

## 2023-03-20 DIAGNOSIS — Z23 Encounter for immunization: Secondary | ICD-10-CM

## 2023-03-20 NOTE — Patient Instructions (Signed)
Continue your medications.  Follow up in 6 months.  Take care  Dr. Avelino Herren  

## 2023-03-21 MED ORDER — SIMVASTATIN 20 MG PO TABS
ORAL_TABLET | ORAL | 1 refills | Status: DC
Start: 2023-03-21 — End: 2023-09-22

## 2023-03-21 MED ORDER — PHENYTOIN SODIUM EXTENDED 100 MG PO CAPS: ORAL_CAPSULE | ORAL | 3 refills | Status: DC

## 2023-03-21 NOTE — Assessment & Plan Note (Signed)
Stable. Continue medications

## 2023-03-21 NOTE — Progress Notes (Signed)
Subjective:  Patient ID: Brent Suarez, male    DOB: [birth year]  Age: 77 y.o. 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  Overall, he is doing well.  His main concerns are related to the care of his wife who is declining.  Patient has a longstanding history of seizure disorder.  Has not had a seizure in many years.  He is on phenobarbital and phenytoin.  I have been unable to convince him to consider tapering discontinuation.  Denies chest pain or shortness of breath.  Overall he is feeling well.  Hyperlipidemia stable on Zocor.  Desires his flu shot today.  Patient Active Problem List   Diagnosis Date Noted   Prediabetes 09/09/2017   History of colon cancer 01/31/2015   Elevated alkaline phosphatase level 04/15/2013   Seizure disorder (HCC) 02/04/2013   Hyperlipidemia 02/04/2013   Prostate cancer (HCC) 10/10/2011    Social Hx   Social History   Socioeconomic History   Marital status: Married    Spouse name: Not on file   Number of children: 2   Years of education: Not on file   Highest education level: 12th grade  Occupational History    Employer: [employer]  Tobacco Use   Smoking status: Former    Current packs/day: 0.00    Average packs/day: 1 pack/day for 25.0 years (25.0 ttl pk-yrs)    Types: Cigarettes    Start date: 05/14/1963    Quit date: 05/13/1988    Years since quitting: 34.8   Smokeless tobacco: Never  Substance and Sexual Activity   Alcohol use: No    Alcohol/week: 0.0 standard drinks of alcohol   Drug use: No   Sexual activity: Not Currently  Other Topics Concern   Not on file  Social History Narrative   Not on file   Social Determinants of Health   Financial Resource Strain: Patient Declined (03/20/2023)   Overall Financial Resource Strain (CARDIA)    Difficulty of  Paying Living Expenses: Patient declined  Food Insecurity: Patient Declined (03/20/2023)   Hunger Vital Sign    Worried About Running Out of Food in the Last Year: Patient declined    Ran Out of Food in the Last Year: Patient declined  Transportation Needs: Patient Declined (03/20/2023)   PRAPARE - Administrator, Civil Service (Medical): Patient declined    Lack of Transportation (Non-Medical): Patient declined  Physical Activity: Unknown (03/20/2023)   Exercise Vital Sign    Days of Exercise per Week: Patient declined    Minutes of Exercise per Session: Not on file  Stress: No Stress Concern Present (07/10/2021)   [employer] of Occupational Health - Occupational Stress Questionnaire    Feeling of Stress : Not at all  Social Connections: Unknown (03/20/2023)   Social Connection and Isolation Panel [NHANES]    Frequency of Communication with Friends and Family: Patient declined    Frequency of Social Gatherings with Friends and Family: Patient declined    Attends Religious Services: Patient declined    Database administrator or Organizations: Patient declined    Attends Banker Meetings: Not on file    Marital Status: Married    Review of Systems Per HPI  Objective:  BP 138/84  Pulse 74   Temp (!) 97.2 F (36.2 C)   Ht 5\' 8"  (1.727 m)   Wt 209 lb (94.8 kg)   SpO2 96%   BMI 31.78 kg/m      03/20/2023    1:34 PM 09/17/2022    1:16 PM 12/31/2021    2:02 PM  BP/Weight  Systolic BP 138 109 128  Diastolic BP 84 67 79  Wt. (Lbs) 209 208 201.6  BMI 31.78 kg/m2 31.63 kg/m2 30.65 kg/m2    Physical Exam Vitals and nursing note reviewed.  Constitutional:      General: He is not in acute distress.    Appearance: Normal appearance.  HENT:     Head: Normocephalic and atraumatic.  Eyes:     General:        Right eye: No discharge.        Left eye: No discharge.     Conjunctiva/sclera: Conjunctivae normal.  Cardiovascular:     Rate and Rhythm:  Normal rate and regular rhythm.  Pulmonary:     Effort: Pulmonary effort is normal.     Breath sounds: Normal breath sounds. No wheezing, rhonchi or rales.  Neurological:     Mental Status: He is alert.  Psychiatric:        Mood and Affect: Mood normal.        Behavior: Behavior normal.     Lab Results  Component Value Date   WBC 5.9 09/17/2022   HGB 13.3 09/17/2022   HCT 39.4 09/17/2022   PLT 181 09/17/2022   GLUCOSE 85 09/17/2022   CHOL 137 09/17/2022   TRIG 215 (H) 09/17/2022   HDL 31 (L) 09/17/2022   LDLCALC 70 09/17/2022   ALT 13 09/17/2022   AST 21 09/17/2022   NA 141 09/17/2022   K 4.3 09/17/2022   CL 104 09/17/2022   CREATININE 0.75 (L) 09/17/2022   BUN 14 09/17/2022   CO2 24 09/17/2022   INR 1.03 02/14/2009     Assessment & Plan:   Problem List Items Addressed This Visit       Nervous and Auditory   Seizure disorder (HCC)    Stable.  Continue medications.      Relevant Medications   phenytoin (DILANTIN) 100 MG ER capsule     Other   Hyperlipidemia    Stable on Zocor.  Continue.      Relevant Medications   simvastatin (ZOCOR) 20 MG tablet   Other Visit Diagnoses     Immunization due       Relevant Orders   Flu Vaccine Trivalent High Dose (Fluad) (Completed)       Meds ordered this encounter  Medications   simvastatin (ZOCOR) 20 MG tablet    Sig: TAKE 1 TABLET(20 MG) BY MOUTH DAILY    Dispense:  90 tablet    Refill:  1   phenytoin (DILANTIN) 100 MG ER capsule    Sig: TAKE 3 CAPSULES(300 MG) BY MOUTH AT BEDTIME    Dispense:  270 capsule    Refill:  3    Follow-up:  6 months  Yumna Ebers Adriana Simas DO [facility]

## 2023-03-21 NOTE — Assessment & Plan Note (Signed)
Stable on Zocor. Continue. 

## 2023-03-26 ENCOUNTER — Other Ambulatory Visit: Payer: Self-pay | Admitting: Family Medicine

## 2023-03-26 DIAGNOSIS — E785 Hyperlipidemia, unspecified: Secondary | ICD-10-CM

## 2023-04-01 ENCOUNTER — Other Ambulatory Visit: Payer: Self-pay | Admitting: Family Medicine

## 2023-04-01 DIAGNOSIS — G40909 Epilepsy, unspecified, not intractable, without status epilepticus: Secondary | ICD-10-CM

## 2023-06-03 ENCOUNTER — Other Ambulatory Visit: Payer: Self-pay | Admitting: Family Medicine

## 2023-06-03 DIAGNOSIS — N400 Enlarged prostate without lower urinary tract symptoms: Secondary | ICD-10-CM

## 2023-07-01 ENCOUNTER — Ambulatory Visit (INDEPENDENT_AMBULATORY_CARE_PROVIDER_SITE_OTHER): Payer: Medicare Other | Admitting: Family Medicine

## 2023-07-01 VITALS — BP 132/78 | Ht 68.0 in | Wt 213.0 lb

## 2023-07-01 DIAGNOSIS — R030 Elevated blood-pressure reading, without diagnosis of hypertension: Secondary | ICD-10-CM | POA: Diagnosis not present

## 2023-07-01 NOTE — Assessment & Plan Note (Addendum)
No indications for pharmacotherapy at this time.  Advised to decrease and/or discontinue caffeine intake.  Healthy diet with lower salt intake.  Supportive care.

## 2023-07-01 NOTE — Patient Instructions (Signed)
Healthy diet.   Stay active.  No need for BP medication.

## 2023-07-01 NOTE — Progress Notes (Signed)
Subjective:  Patient ID: Brent Suarez, male    DOB: 1945/06/21  Age: 78 y.o. MRN: [id number]  CC:   Elevated BP  HPI:  78 year old male with the below mentioned medical problems presents with concerns about elevated blood pressure.  He had noticed that his blood pressure elevated at time, particularly after eating a lot of salty food like pork and also after drinking coffee.  Denies chest pain or shortness of breath.  He has no associated symptoms with his elevated blood pressure.  He states that it is transient and then returns back to normal.  BP 132/78 here today.  Patient Active Problem List   Diagnosis Date Noted   Elevated BP without diagnosis of hypertension 07/01/2023   Prediabetes 09/09/2017   History of colon cancer 01/31/2015   Elevated alkaline phosphatase level 04/15/2013   Seizure disorder (HCC) 02/04/2013   Hyperlipidemia 02/04/2013   Prostate cancer (HCC) 10/10/2011    Social Hx   Social History   Socioeconomic History   Marital status: Married    Spouse name: Not on file   Number of children: 2   Years of education: Not on file   Highest education level: 12th grade  Occupational History    Employer: [employer]  Tobacco Use   Smoking status: Former    Current packs/day: 0.00    Average packs/day: 1 pack/day for 25.0 years (25.0 ttl pk-yrs)    Types: Cigarettes    Start date: 05/14/1963    Quit date: 05/13/1988    Years since quitting: 35.1   Smokeless tobacco: Never  Substance and Sexual Activity   Alcohol use: No    Alcohol/week: 0.0 standard drinks of alcohol   Drug use: No   Sexual activity: Not Currently  Other Topics Concern   Not on file  Social History Narrative   Not on file   Social Drivers of Health   Financial Resource Strain: Patient Declined (03/20/2023)   Overall Financial Resource Strain (CARDIA)    Difficulty of Paying Living Expenses: Patient declined  Food Insecurity: Patient Declined (03/20/2023)   Hunger Vital Sign     Worried About Running Out of Food in the Last Year: Patient declined    Ran Out of Food in the Last Year: Patient declined  Transportation Needs: Patient Declined (03/20/2023)   PRAPARE - Administrator, Civil Service (Medical): Patient declined    Lack of Transportation (Non-Medical): Patient declined  Physical Activity: Unknown (03/20/2023)   Exercise Vital Sign    Days of Exercise per Week: Patient declined    Minutes of Exercise per Session: Not on file  Stress: No Stress Concern Present (07/10/2021)   [employer] of Occupational Health - Occupational Stress Questionnaire    Feeling of Stress : Not at all  Social Connections: Unknown (03/20/2023)   Social Connection and Isolation Panel [NHANES]    Frequency of Communication with Friends and Family: Patient declined    Frequency of Social Gatherings with Friends and Family: Patient declined    Attends Religious Services: Patient declined    Database administrator or Organizations: Patient declined    Attends Engineer, structural: Not on file    Marital Status: Married    Review of Systems Per HPI  Objective:  BP 132/78   Ht 5\' 8"  (1.727 m)   Wt 213 lb (96.6 kg)   BMI 32.39 kg/m      07/01/2023    3:48 PM 03/20/2023  1:34 PM 09/17/2022    1:16 PM  BP/Weight  Systolic BP 132 138 109  Diastolic BP 78 84 67  Wt. (Lbs) 213 209 208  BMI 32.39 kg/m2 31.78 kg/m2 31.63 kg/m2    Physical Exam Vitals and nursing note reviewed.  Constitutional:      General: He is not in acute distress.    Appearance: Normal appearance.  HENT:     Head: Normocephalic and atraumatic.  Cardiovascular:     Rate and Rhythm: Normal rate and regular rhythm.  Pulmonary:     Effort: Pulmonary effort is normal.     Breath sounds: Normal breath sounds.  Neurological:     Mental Status: He is alert.  Psychiatric:        Mood and Affect: Mood normal.        Behavior: Behavior normal.     Lab Results  Component Value  Date   WBC 5.9 09/17/2022   HGB 13.3 09/17/2022   HCT 39.4 09/17/2022   PLT 181 09/17/2022   GLUCOSE 85 09/17/2022   CHOL 137 09/17/2022   TRIG 215 (H) 09/17/2022   HDL 31 (L) 09/17/2022   LDLCALC 70 09/17/2022   ALT 13 09/17/2022   AST 21 09/17/2022   NA 141 09/17/2022   K 4.3 09/17/2022   CL 104 09/17/2022   CREATININE 0.75 (L) 09/17/2022   BUN 14 09/17/2022   CO2 24 09/17/2022   INR 1.03 02/14/2009     Assessment & Plan:  Elevated BP without diagnosis of hypertension Assessment & Plan: No indications for pharmacotherapy at this time.  Advised to decrease and/or discontinue caffeine intake.  Healthy diet with lower salt intake.  Supportive care.    Follow-up:  Return in about 6 months (around 12/29/2023).  Everlene Other DO [facility]

## 2023-07-02 ENCOUNTER — Ambulatory Visit: Payer: 59 | Admitting: Family Medicine

## 2023-07-17 ENCOUNTER — Other Ambulatory Visit: Payer: Self-pay | Admitting: Family Medicine

## 2023-07-17 DIAGNOSIS — G40909 Epilepsy, unspecified, not intractable, without status epilepticus: Secondary | ICD-10-CM

## 2023-09-17 ENCOUNTER — Ambulatory Visit: Payer: 59 | Admitting: Family Medicine

## 2023-09-19 ENCOUNTER — Other Ambulatory Visit: Payer: Self-pay | Admitting: Family Medicine

## 2023-09-19 DIAGNOSIS — N400 Enlarged prostate without lower urinary tract symptoms: Secondary | ICD-10-CM

## 2023-09-19 DIAGNOSIS — E785 Hyperlipidemia, unspecified: Secondary | ICD-10-CM

## 2023-10-01 ENCOUNTER — Ambulatory Visit: Payer: Self-pay

## 2023-10-01 NOTE — Telephone Encounter (Signed)
 Noted

## 2023-10-01 NOTE — Telephone Encounter (Signed)
  Chief Complaint: foot/ankle swelling Symptoms: bilateral ankle swelling, right ankle swelling, nosebleeds (intermittent) Frequency: x 1 week Pertinent Negatives: Patient denies chest pain, SOB, fever Disposition: [] ED /[] Urgent Care (no appt availability in office) / [x] Appointment(In office/virtual)/ []  [facility]/ [] Home Care/ [] Refused Recommended Disposition /[] [facility]/ []  Follow-up with PCP Additional Notes: Patient and his son, Autry Legions, on the phone for triage. Patient states he has been using an ice pack and taking ibuprofen for his ankle and foot swelling. Patient unable to answer when asked when his most recent nosebleed was, however he states it is not currently bleeding. Patient's wife scheduled for visit tomorrow, patient asking to be seen tomorrow as well. Advised soonest available appt is 11:20 AM. Patient's son asked if they can be seen together and advised they would both need their own appt time.  Reason for Disposition  MILD or MODERATE ankle swelling (e.g., can't move joint normally, can't do usual activities) (Exceptions: Itchy, localized swelling; swelling is chronic.)  Answer Assessment - Initial Assessment Questions 1. LOCATION: "Which ankle is swollen?" "Where is the swelling?"    Left and right feet, and right ankle (medial side).  2. ONSET: "When did the swelling start?"     A week ago.  3. SWELLING: "How bad is the swelling?" Or, "How large is it?" (e.g., mild, moderate, severe; size of localized swelling)    - NONE: No joint swelling.   - LOCALIZED: Localized; small area of puffy or swollen skin (e.g., insect bite, skin irritation).   - MILD: Joint looks or feels mildly swollen or puffy.   - MODERATE: Swollen; interferes with normal activities (e.g., work or school); decreased range of movement; may be limping.   - SEVERE: Very swollen; can't move swollen joint at all; limping a lot or unable to walk.     Mild.  4. PAIN: "Is there any  pain?" If Yes, ask: "How bad is it?" (Scale 1-10; or mild, moderate, severe)   - NONE (0): no pain.   - MILD (1-3): doesn't interfere with normal activities.    - MODERATE (4-7): interferes with normal activities (e.g., work or school) or awakens from sleep, limping.    - SEVERE (8-10): excruciating pain, unable to do any normal activities, unable to walk.      7/10.  5. CAUSE: "What do you think caused the ankle swelling?"     Denies history of gout, CHF or diabetes.  6. OTHER SYMPTOMS: "Do you have any other symptoms?" (e.g., fever, chest pain, difficulty breathing, calf pain)     Nosebleeds (when it gets dry in the house).  7. PREGNANCY: "Is there any chance you are pregnant?" "When was your last menstrual period?"     N/A.  Protocols used: Ankle Swelling-A-AH

## 2023-10-02 ENCOUNTER — Ambulatory Visit: Admitting: Family Medicine

## 2023-10-08 ENCOUNTER — Ambulatory Visit (INDEPENDENT_AMBULATORY_CARE_PROVIDER_SITE_OTHER): Admitting: Family Medicine

## 2023-10-08 VITALS — BP 118/72 | HR 68 | Temp 97.0°F | Ht 68.0 in | Wt 213.0 lb

## 2023-10-08 DIAGNOSIS — B351 Tinea unguium: Secondary | ICD-10-CM | POA: Diagnosis not present

## 2023-10-08 DIAGNOSIS — M25571 Pain in right ankle and joints of right foot: Secondary | ICD-10-CM

## 2023-10-08 NOTE — Patient Instructions (Signed)
 Elevation. Compression.  Referral placed.

## 2023-10-09 DIAGNOSIS — M25571 Pain in right ankle and joints of right foot: Secondary | ICD-10-CM | POA: Insufficient documentation

## 2023-10-09 DIAGNOSIS — B351 Tinea unguium: Secondary | ICD-10-CM | POA: Insufficient documentation

## 2023-10-09 NOTE — Assessment & Plan Note (Signed)
 Patient would like to see podiatry.  Advised compression.  Referral placed.

## 2023-10-09 NOTE — Progress Notes (Signed)
 Subjective:  Patient ID: Brent Suarez, male    DOB: 26-Apr-1946  Age: 78 y.o. MRN: [id number]  CC:   Chief Complaint  Patient presents with   right ankle swelling     On and off couple of week - has iced and taken ibuprofen  Request podiatry referral     HPI:  78 year old male presents for evaluation of right ankle swelling.  Intermittent right ankle swelling over the past few weeks.  Improves with elevation and Motrin.  Has known varicosities.  Previously seen by vascular.  Recommended compression.  No recent fall, trauma, injury.  No other complaints at this time.  Patient Active Problem List   Diagnosis Date Noted   Right ankle pain 10/09/2023   Onychomycosis 10/09/2023   Prediabetes 09/09/2017   History of colon cancer 01/31/2015   Elevated alkaline phosphatase level 04/15/2013   Seizure disorder (HCC) 02/04/2013   Hyperlipidemia 02/04/2013   Prostate cancer (HCC) 10/10/2011    Social Hx   Social History   Socioeconomic History   Marital status: Married    Spouse name: Not on file   Number of children: 2   Years of education: Not on file   Highest education level: 12th grade  Occupational History    Employer: [employer]  Tobacco Use   Smoking status: Former    Current packs/day: 0.00    Average packs/day: 1 pack/day for 25.0 years (25.0 ttl pk-yrs)    Types: Cigarettes    Start date: 05/14/1963    Quit date: 05/13/1988    Years since quitting: 35.4   Smokeless tobacco: Never  Substance and Sexual Activity   Alcohol use: No    Alcohol/week: 0.0 standard drinks of alcohol   Drug use: No   Sexual activity: Not Currently  Other Topics Concern   Not on file  Social History Narrative   Not on file   Social Drivers of Health   Financial Resource Strain: Patient Declined (03/20/2023)   Overall Financial Resource Strain (CARDIA)    Difficulty of Paying Living Expenses: Patient declined  Food Insecurity: Patient Declined (03/20/2023)   Hunger Vital Sign     Worried About Running Out of Food in the Last Year: Patient declined    Ran Out of Food in the Last Year: Patient declined  Transportation Needs: Patient Declined (03/20/2023)   PRAPARE - Administrator, Civil Service (Medical): Patient declined    Lack of Transportation (Non-Medical): Patient declined  Physical Activity: Unknown (03/20/2023)   Exercise Vital Sign    Days of Exercise per Week: Patient declined    Minutes of Exercise per Session: Not on file  Stress: No Stress Concern Present (07/10/2021)   [employer] of Occupational Health - Occupational Stress Questionnaire    Feeling of Stress : Not at all  Social Connections: Unknown (03/20/2023)   Social Connection and Isolation Panel [NHANES]    Frequency of Communication with Friends and Family: Patient declined    Frequency of Social Gatherings with Friends and Family: Patient declined    Attends Religious Services: Patient declined    Database administrator or Organizations: Patient declined    Attends Engineer, structural: Not on file    Marital Status: Married    Review of Systems Per HPI  Objective:  BP 118/72   Pulse 68   Temp (!) 97 F (36.1 C)   Ht 5\' 8"  (1.727 m)   Wt 213 lb (96.6 kg)   SpO2  96%   BMI 32.39 kg/m      10/08/2023    1:54 PM 07/01/2023    3:48 PM 03/20/2023    1:34 PM  BP/Weight  Systolic BP 118 132 138  Diastolic BP 72 78 84  Wt. (Lbs) 213 213 209  BMI 32.39 kg/m2 32.39 kg/m2 31.78 kg/m2    Physical Exam Vitals and nursing note reviewed.  Constitutional:      General: He is not in acute distress. HENT:     Head: Normocephalic and atraumatic.  Cardiovascular:     Rate and Rhythm: Normal rate and regular rhythm.  Pulmonary:     Effort: Pulmonary effort is normal.     Breath sounds: Normal breath sounds.  Musculoskeletal:     Comments: Right lower extremity with mild edema. Onychomycosis noted.  Neurological:     Mental Status: He is alert.     Lab  Results  Component Value Date   WBC 5.9 09/17/2022   HGB 13.3 09/17/2022   HCT 39.4 09/17/2022   PLT 181 09/17/2022   GLUCOSE 85 09/17/2022   CHOL 137 09/17/2022   TRIG 215 (H) 09/17/2022   HDL 31 (L) 09/17/2022   LDLCALC 70 09/17/2022   ALT 13 09/17/2022   AST 21 09/17/2022   NA 141 09/17/2022   K 4.3 09/17/2022   CL 104 09/17/2022   CREATININE 0.75 (L) 09/17/2022   BUN 14 09/17/2022   CO2 24 09/17/2022   INR 1.03 02/14/2009     Assessment & Plan:  Right ankle pain, unspecified chronicity Assessment & Plan: Patient would like to see podiatry.  Advised compression.  Referral placed.  Orders: -     Ambulatory referral to Podiatry  Onychomycosis -     Ambulatory referral to Podiatry    Kathleen Papa DO [facility]

## 2023-10-12 ENCOUNTER — Other Ambulatory Visit: Payer: Self-pay | Admitting: Family Medicine

## 2023-10-12 DIAGNOSIS — G40909 Epilepsy, unspecified, not intractable, without status epilepticus: Secondary | ICD-10-CM

## 2023-10-13 ENCOUNTER — Other Ambulatory Visit: Payer: Self-pay

## 2023-10-13 DIAGNOSIS — G40909 Epilepsy, unspecified, not intractable, without status epilepticus: Secondary | ICD-10-CM

## 2023-10-14 MED ORDER — PHENOBARBITAL 30 MG PO TABS: 90.0000 mg | ORAL_TABLET | Freq: Every day | ORAL | 0 refills | Status: DC

## 2023-10-15 ENCOUNTER — Other Ambulatory Visit: Payer: Self-pay | Admitting: Family Medicine

## 2023-10-15 DIAGNOSIS — G40909 Epilepsy, unspecified, not intractable, without status epilepticus: Secondary | ICD-10-CM

## 2023-10-29 ENCOUNTER — Ambulatory Visit (INDEPENDENT_AMBULATORY_CARE_PROVIDER_SITE_OTHER): Admitting: Podiatry

## 2023-10-29 DIAGNOSIS — M76821 Posterior tibial tendinitis, right leg: Secondary | ICD-10-CM | POA: Diagnosis not present

## 2023-10-29 NOTE — Progress Notes (Unsigned)
 Right posterior tibvial tennditis CAM boot

## 2023-11-28 ENCOUNTER — Ambulatory Visit (INDEPENDENT_AMBULATORY_CARE_PROVIDER_SITE_OTHER): Admitting: Podiatry

## 2023-11-28 DIAGNOSIS — Q666 Other congenital valgus deformities of feet: Secondary | ICD-10-CM

## 2023-11-28 DIAGNOSIS — M76821 Posterior tibial tendinitis, right leg: Secondary | ICD-10-CM | POA: Diagnosis not present

## 2023-11-28 NOTE — Progress Notes (Signed)
 Subjective:  Patient ID: Brent Suarez, male    DOB: 1945-11-20,  MRN: [id number]  Chief Complaint  Patient presents with   Foot Pain    RT foot pain - swelling.  Has not been wearing boot for a week and a half as caused top of leg to hurt.  Has been problems with cramps at night on LT leg    78 y.o. male presents with the above complaint.  Patient presents with right medial foot pain that has been going for quite some time is progressive gotten worse worse with ambulation or shoe pressure he has not seen anyone as prior to seeing me.  He would like to discuss treatment options for this.  He has got some swelling associated with it.  Pain is 5 out of 10 dull achy in nature.   Review of Systems: Negative except as noted in the HPI. Denies N/V/F/Ch.  Past Medical History:  Diagnosis Date   Epilepsy (HCC)    hx   Glucose intolerance (impaired glucose tolerance)    Hyperlipidemia    taking medication    Hypertension    Prostate cancer (HCC) 10/10/11   bx=Adenocarcinoma,gleason=3+3=6,PSA=6.03,PSA on 08/18/2011=5.35   SBO (small bowel obstruction) (HCC) 10/19/210   Seizures (HCC)    hx  last seizure 25 years ago   UTI (lower urinary tract infection)    hx    Current Outpatient Medications:    aspirin 81 MG tablet, Take 81 mg by mouth every evening. , Disp: , Rfl:    cholecalciferol (VITAMIN D ) 1000 units tablet, Take 1 tablet (1,000 Units total) by mouth at bedtime., Disp: 90 tablet, Rfl: 1   Omega-3 Fatty Acids (FISH OIL) 1000 MG CAPS, Take 1 capsule by mouth at bedtime., Disp: , Rfl:    PHENObarbital  (LUMINAL) 30 MG tablet, TAKE 3 TABLETS BY MOUTH AT BEDTIME, Disp: 270 tablet, Rfl: 0   phenytoin  (DILANTIN ) 100 MG ER capsule, TAKE 3 CAPSULES(300 MG) BY MOUTH AT BEDTIME, Disp: 270 capsule, Rfl: 3   polyethylene glycol powder (GLYCOLAX /MIRALAX ) powder, Take 17 g by mouth 2 (two) times daily., Disp: 255 g, Rfl: 0   simvastatin  (ZOCOR ) 20 MG tablet, Take 1 tablet by mouth once daily,  Disp: 90 tablet, Rfl: 0   tamsulosin  (FLOMAX ) 0.4 MG CAPS capsule, TAKE 1 CAPSULE BY MOUTH IN THE EVENING WITH FOOD, Disp: 90 capsule, Rfl: 0   vitamin C (ASCORBIC ACID) 500 MG tablet, Take 500 mg by mouth at bedtime., Disp: , Rfl:   Social History   Tobacco Use  Smoking Status Former   Current packs/day: 0.00   Average packs/day: 1 pack/day for 25.0 years (25.0 ttl pk-yrs)   Types: Cigarettes   Start date: 05/14/1963   Quit date: 05/13/1988   Years since quitting: 35.5  Smokeless Tobacco Never    No Known Allergies Objective:  There were no vitals filed for this visit. There is no height or weight on file to calculate BMI. Constitutional Well developed. Well nourished.  Vascular Dorsalis pedis pulses palpable bilaterally. Posterior tibial pulses palpable bilaterally. Capillary refill normal to all digits.  No cyanosis or clubbing noted. Pedal hair growth normal.  Neurologic Normal speech. Oriented to person, place, and time. Epicritic sensation to light touch grossly present bilaterally.  Dermatologic Nails well groomed and normal in appearance. No open wounds. No skin lesions.  Orthopedic: Pain on palpation to the right medial foot.  Pain along the course of the posterior tibial tendon pain with resisted plantarflexion inversion the  foot resisted.  No pain with dorsiflexion eversion of the foot.   Radiographs: None Assessment:   1. Posterior tibial tendinitis of right leg   2. Pes planovalgus    Plan:  Patient was evaluated and treated and all questions answered.  Right posterior tibial tendinitis with underlying pes planovalgus deformity - All questions and concerns were discussed with the patient in extensive detail - Given the amount of pain that patient is experiencing.  He will benefit from steroid injection help decrease inflammatory component surgical pain.  Patient agrees with plan like to proceed with steroid injection I discussed the risk of rupture surgery  states understand like to proceed. -A steroid injection was performed at right medial foot using 1% plain Lidocaine and 10 mg of Kenalog. This was well tolerated. -Shoe gear modification discussed - Tri-Lock ankle brace dispensed - In the future patient may benefit from orthotics  No follow-ups on file.

## 2023-12-20 ENCOUNTER — Other Ambulatory Visit: Payer: Self-pay | Admitting: Family Medicine

## 2023-12-20 DIAGNOSIS — E785 Hyperlipidemia, unspecified: Secondary | ICD-10-CM

## 2023-12-20 DIAGNOSIS — N400 Enlarged prostate without lower urinary tract symptoms: Secondary | ICD-10-CM

## 2023-12-30 ENCOUNTER — Ambulatory Visit (INDEPENDENT_AMBULATORY_CARE_PROVIDER_SITE_OTHER): Payer: 59 | Admitting: Family Medicine

## 2023-12-30 VITALS — BP 122/64 | HR 71 | Temp 97.2°F | Ht 68.0 in | Wt 211.0 lb

## 2023-12-30 DIAGNOSIS — Z79899 Other long term (current) drug therapy: Secondary | ICD-10-CM

## 2023-12-30 DIAGNOSIS — G40909 Epilepsy, unspecified, not intractable, without status epilepticus: Secondary | ICD-10-CM

## 2023-12-30 DIAGNOSIS — E785 Hyperlipidemia, unspecified: Secondary | ICD-10-CM

## 2023-12-30 MED ORDER — PHENYTOIN SODIUM EXTENDED 100 MG PO CAPS
ORAL_CAPSULE | ORAL | 3 refills | Status: AC
Start: 2023-12-30 — End: ?

## 2023-12-30 NOTE — Patient Instructions (Signed)
 Labs today.  Follow up in 6 months.

## 2023-12-30 NOTE — Assessment & Plan Note (Addendum)
 Stable. Obtaining Dilantin  and Phenytoin  levels today. Continue current meds at current dosages.

## 2023-12-30 NOTE — Assessment & Plan Note (Signed)
 Stable on statin. Continue. Lipid panel today.

## 2023-12-30 NOTE — Progress Notes (Signed)
 Subjective:  Patient ID: Brent Suarez, male    DOB: [birth year]  Age: 78 y.o. MRN: [id number]  CC:   Chief Complaint  Patient presents with   6 month follow up    HPI:  78 year old male presents for follow up.  Patient reports that he is doing fine.  Has not had a seizure in over 30 years. Compliant with Dilantin  and Phenobarbital . Needs labs.  LDL has been at goal on Simvastatin .   Denies chest pain, SOB. Reports good appetite. No complaints or issues at this time.  Patient Active Problem List   Diagnosis Date Noted   Prediabetes 09/09/2017   History of colon cancer 01/31/2015   Elevated alkaline phosphatase level 04/15/2013   Seizure disorder (HCC) 02/04/2013   Hyperlipidemia 02/04/2013   Prostate cancer (HCC) 10/10/2011    Social Hx   Social History   Socioeconomic History   Marital status: Married    Spouse name: Not on file   Number of children: 2   Years of education: Not on file   Highest education level: 12th grade  Occupational History    Employer: [employer]  Tobacco Use   Smoking status: Former    Current packs/day: 0.00    Average packs/day: 1 pack/day for 25.0 years (25.0 ttl pk-yrs)    Types: Cigarettes    Start date: 05/14/1963    Quit date: 05/13/1988    Years since quitting: 35.6   Smokeless tobacco: Never  Substance and Sexual Activity   Alcohol use: No    Alcohol/week: 0.0 standard drinks of alcohol   Drug use: No   Sexual activity: Not Currently  Other Topics Concern   Not on file  Social History Narrative   Not on file   Social Drivers of Health   Financial Resource Strain: Patient Declined (03/20/2023)   Overall Financial Resource Strain (CARDIA)    Difficulty of Paying Living Expenses: Patient declined  Food Insecurity: Patient Declined (03/20/2023)   Hunger Vital Sign    Worried About Running Out of Food in the Last Year: Patient declined    Ran Out of Food in the Last Year: Patient declined  Transportation Needs: Patient  Declined (03/20/2023)   PRAPARE - Administrator, Civil Service (Medical): Patient declined    Lack of Transportation (Non-Medical): Patient declined  Physical Activity: Unknown (03/20/2023)   Exercise Vital Sign    Days of Exercise per Week: Patient declined    Minutes of Exercise per Session: Not on file  Stress: No Stress Concern Present (07/10/2021)   [employer] of Occupational Health - Occupational Stress Questionnaire    Feeling of Stress : Not at all  Social Connections: Unknown (03/20/2023)   Social Connection and Isolation Panel    Frequency of Communication with Friends and Family: Patient declined    Frequency of Social Gatherings with Friends and Family: Patient declined    Attends Religious Services: Patient declined    Database administrator or Organizations: Patient declined    Attends Engineer, structural: Not on file    Marital Status: Married    Review of Systems Per HPI  Objective:  BP 122/64   Pulse 71   Temp (!) 97.2 F (36.2 C)   Ht 5' 8 (1.727 m)   Wt 211 lb (95.7 kg)   SpO2 98%   BMI 32.08 kg/m      12/30/2023   11:09 AM 10/08/2023    1:54 PM 07/01/2023  3:48 PM  BP/Weight  Systolic BP 122 118 132  Diastolic BP 64 72 78  Wt. (Lbs) 211 213 213  BMI 32.08 kg/m2 32.39 kg/m2 32.39 kg/m2    Physical Exam Vitals and nursing note reviewed.  Constitutional:      General: He is not in acute distress.    Appearance: Normal appearance.  HENT:     Head: Normocephalic and atraumatic.  Eyes:     General:        Right eye: No discharge.        Left eye: No discharge.     Conjunctiva/sclera: Conjunctivae normal.  Cardiovascular:     Rate and Rhythm: Normal rate and regular rhythm.  Pulmonary:     Effort: Pulmonary effort is normal.     Breath sounds: Normal breath sounds. No wheezing, rhonchi or rales.  Neurological:     Mental Status: He is alert.  Psychiatric:        Mood and Affect: Mood normal.        Behavior:  Behavior normal.     Lab Results  Component Value Date   WBC 5.9 09/17/2022   HGB 13.3 09/17/2022   HCT 39.4 09/17/2022   PLT 181 09/17/2022   GLUCOSE 85 09/17/2022   CHOL 137 09/17/2022   TRIG 215 (H) 09/17/2022   HDL 31 (L) 09/17/2022   LDLCALC 70 09/17/2022   ALT 13 09/17/2022   AST 21 09/17/2022   NA 141 09/17/2022   K 4.3 09/17/2022   CL 104 09/17/2022   CREATININE 0.75 (L) 09/17/2022   BUN 14 09/17/2022   CO2 24 09/17/2022   INR 1.03 02/14/2009     Assessment & Plan:  Seizure disorder [facility]) Assessment & Plan: Stable. Obtaining Dilantin  and Phenytoin  levels today. Continue current meds at current dosages.  Orders: -     CMP14+EGFR -     Phenytoin  level, free and total -     Phenobarbital  level -     Phenytoin  Sodium Extended; TAKE 3 CAPSULES(300 MG) BY MOUTH AT BEDTIME  Dispense: 270 capsule; Refill: 3  High risk medication use -     CBC -     CMP14+EGFR -     VITAMIN D  25 Hydroxy (Vit-D Deficiency, Fractures)  Hyperlipidemia, unspecified hyperlipidemia type Assessment & Plan: Stable on statin. Continue. Lipid panel today.  Orders: -     Lipid panel    Follow-up:  6 months  Kayse Puccini Bluford DO [facility]

## 2023-12-31 ENCOUNTER — Ambulatory Visit: Payer: Self-pay | Admitting: Family Medicine

## 2023-12-31 LAB — LIPID PANEL
Chol/HDL Ratio: 4.4 ratio (ref 0.0–5.0)
Cholesterol, Total: 153 mg/dL (ref 100–199)
HDL: 35 mg/dL — ABNORMAL LOW (ref >39)
LDL Chol Calc (NIH): 93 mg/dL (ref 0–99)
Triglycerides: 138 mg/dL (ref 0–149)
VLDL Cholesterol Cal: 25 mg/dL (ref 5–40)

## 2023-12-31 LAB — CMP14+EGFR
ALT: 14 IU/L (ref 0–44)
AST: 22 IU/L (ref 0–40)
Albumin: 4.3 g/dL (ref 3.8–4.8)
Alkaline Phosphatase: 194 IU/L — ABNORMAL HIGH (ref 44–121)
BUN/Creatinine Ratio: 15 (ref 10–24)
BUN: 13 mg/dL (ref 8–27)
Bilirubin Total: 0.3 mg/dL (ref 0.0–1.2)
CO2: 25 mmol/L (ref 20–29)
Calcium: 8.9 mg/dL (ref 8.6–10.2)
Chloride: 102 mmol/L (ref 96–106)
Creatinine, Ser: 0.84 mg/dL (ref 0.76–1.27)
Globulin, Total: 2.8 g/dL (ref 1.5–4.5)
Glucose: 81 mg/dL (ref 70–99)
Potassium: 4.8 mmol/L (ref 3.5–5.2)
Sodium: 138 mmol/L (ref 134–144)
Total Protein: 7.1 g/dL (ref 6.0–8.5)
eGFR: 90 mL/min/1.73 (ref >59)

## 2023-12-31 LAB — PHENYTOIN LEVEL, FREE AND TOTAL
Phenytoin, Free: 1 ug/mL (ref 1.0–2.0)
Phenytoin, Serum: 14.4 ug/mL (ref 10.0–20.0)

## 2023-12-31 LAB — CBC
Hematocrit: 41.7 % (ref 37.5–51.0)
Hemoglobin: 13.9 g/dL (ref 13.0–17.7)
MCH: 29.4 pg (ref 26.6–33.0)
MCHC: 33.3 g/dL (ref 31.5–35.7)
MCV: 88 fL (ref 79–97)
Platelets: 178 x10E3/uL (ref 150–450)
RBC: 4.73 x10E6/uL (ref 4.14–5.80)
RDW: 13.4 % (ref 11.6–15.4)
WBC: 5.2 x10E3/uL (ref 3.4–10.8)

## 2023-12-31 LAB — VITAMIN D 25 HYDROXY (VIT D DEFICIENCY, FRACTURES): Vit D, 25-Hydroxy: 34.6 ng/mL (ref 30.0–100.0)

## 2023-12-31 LAB — PHENOBARBITAL LEVEL: Phenobarbital, Serum: 14 ug/mL — ABNORMAL LOW (ref 15–40)

## 2024-01-06 ENCOUNTER — Other Ambulatory Visit: Payer: Self-pay | Admitting: Family Medicine

## 2024-01-06 DIAGNOSIS — G40909 Epilepsy, unspecified, not intractable, without status epilepticus: Secondary | ICD-10-CM

## 2024-01-29 ENCOUNTER — Ambulatory Visit (INDEPENDENT_AMBULATORY_CARE_PROVIDER_SITE_OTHER): Admitting: Podiatry

## 2024-01-29 ENCOUNTER — Encounter: Payer: Self-pay | Admitting: Podiatry

## 2024-01-29 DIAGNOSIS — B351 Tinea unguium: Secondary | ICD-10-CM

## 2024-01-29 DIAGNOSIS — M79675 Pain in left toe(s): Secondary | ICD-10-CM

## 2024-01-29 DIAGNOSIS — M79674 Pain in right toe(s): Secondary | ICD-10-CM | POA: Diagnosis not present

## 2024-01-29 NOTE — Progress Notes (Signed)
 This patient presents to the office with chief complaint of long thick painful nails.  Patient says the nails are painful walking and wearing shoes.  This patient is unable to self treat.  This patient is unable to trim his  nails since he is unable to reach his nails.  he presents to the office for preventative foot care services.  General Appearance  Alert, conversant and in no acute stress.  Vascular  Dorsalis pedis and posterior tibial  pulses are palpable  bilaterally.  Capillary return is within normal limits  bilaterally. Temperature is within normal limits  bilaterally.  Neurologic  Senn-Weinstein monofilament wire test within normal limits  bilaterally. Muscle power within normal limits bilaterally.  Nails Thick disfigured discolored nails with subungual debris  from hallux to fifth toes bilaterally. No evidence of bacterial infection or drainage bilaterally.  Orthopedic  No limitations of motion  feet .  No crepitus or effusions noted.  No bony pathology or digital deformities noted.  Skin  normotropic skin with no porokeratosis noted bilaterally.  No signs of infections or ulcers noted.     Onychomycosis  Nails  B/L.  Pain in right toes  Pain in left toes  Debridement of nails both feet followed trimming the nails with dremel tool.    RTC 3 months.   Helane Gunther DPM

## 2024-03-19 ENCOUNTER — Other Ambulatory Visit: Payer: Self-pay

## 2024-03-19 ENCOUNTER — Other Ambulatory Visit: Payer: Self-pay | Admitting: Family Medicine

## 2024-03-19 DIAGNOSIS — E785 Hyperlipidemia, unspecified: Secondary | ICD-10-CM

## 2024-03-19 DIAGNOSIS — G40909 Epilepsy, unspecified, not intractable, without status epilepticus: Secondary | ICD-10-CM

## 2024-03-19 DIAGNOSIS — N400 Enlarged prostate without lower urinary tract symptoms: Secondary | ICD-10-CM

## 2024-04-03 ENCOUNTER — Other Ambulatory Visit: Payer: Self-pay | Admitting: Family Medicine

## 2024-04-03 DIAGNOSIS — G40909 Epilepsy, unspecified, not intractable, without status epilepticus: Secondary | ICD-10-CM

## 2024-04-06 ENCOUNTER — Other Ambulatory Visit: Payer: Self-pay

## 2024-04-06 ENCOUNTER — Other Ambulatory Visit: Payer: Self-pay | Admitting: Family Medicine

## 2024-04-06 DIAGNOSIS — G40909 Epilepsy, unspecified, not intractable, without status epilepticus: Secondary | ICD-10-CM

## 2024-04-06 MED ORDER — PHENOBARBITAL 30 MG PO TABS: 90.0000 mg | ORAL_TABLET | Freq: Every day | ORAL | 0 refills | Status: DC

## 2024-04-06 MED ORDER — PHENOBARBITAL 30 MG PO TABS: 90.0000 mg | ORAL_TABLET | Freq: Every day | ORAL | 0 refills | Status: AC

## 2024-04-06 NOTE — Telephone Encounter (Signed)
 Copied from CRM [id number]. Topic: Clinical - Medication Refill >> Apr 06, 2024 10:56 AM Tobias L wrote: Medication: PHENObarbital  (LUMINAL) 30 MG tablet  Has the patient contacted their pharmacy? Yes Told to contact the office.   This is the patient's preferred pharmacy:  [facility] [address] [place] - [address] [place] [address] [place] [address] Phone: [phone] Fax: [phone]  Is this the correct pharmacy for this prescription? Yes  Has the prescription been filled recently? No  Is the patient out of the medication? No  Has the patient been seen for an appointment in the last year OR does the patient have an upcoming appointment? Yes  Can we respond through MyChart? No  Agent: Please be advised that Rx refills may take up to 3 business days. We ask that you follow-up with your pharmacy.

## 2024-04-29 ENCOUNTER — Encounter: Payer: Self-pay | Admitting: Podiatry

## 2024-04-29 ENCOUNTER — Ambulatory Visit: Admitting: Podiatry

## 2024-04-29 VITALS — Ht 68.0 in | Wt 211.0 lb

## 2024-04-29 DIAGNOSIS — B351 Tinea unguium: Secondary | ICD-10-CM

## 2024-04-29 DIAGNOSIS — M79674 Pain in right toe(s): Secondary | ICD-10-CM

## 2024-04-29 DIAGNOSIS — M79675 Pain in left toe(s): Secondary | ICD-10-CM | POA: Diagnosis not present

## 2024-04-29 NOTE — Progress Notes (Signed)
 This patient presents to the office with chief complaint of long thick painful nails.  Patient says the nails are painful walking and wearing shoes.  This patient is unable to self treat.  This patient is unable to trim his  nails since he is unable to reach his nails.  he presents to the office for preventative foot care services.  General Appearance  Alert, conversant and in no acute stress.  Vascular  Dorsalis pedis and posterior tibial  pulses are palpable  bilaterally.  Capillary return is within normal limits  bilaterally. Temperature is within normal limits  bilaterally.  Neurologic  Senn-Weinstein monofilament wire test within normal limits  bilaterally. Muscle power within normal limits bilaterally.  Nails Thick disfigured discolored nails with subungual debris  from hallux to fifth toes bilaterally. No evidence of bacterial infection or drainage bilaterally.  Orthopedic  No limitations of motion  feet .  No crepitus or effusions noted.  No bony pathology or digital deformities noted.  Skin  normotropic skin with no porokeratosis noted bilaterally.  No signs of infections or ulcers noted.     Onychomycosis  Nails  B/L.  Pain in right toes  Pain in left toes  Debridement of nails both feet followed trimming the nails with dremel tool.    RTC 3 months.   Helane Gunther DPM

## 2024-07-28 ENCOUNTER — Ambulatory Visit: Admitting: Podiatry
